# Patient Record
Sex: Female | Born: 1985 | Race: White | Hispanic: No | Marital: Married | Smoking: Never smoker
Health system: Southern US, Community
[De-identification: ages and names within clinical notes are randomized; demographics above are authoritative.]

## PROBLEM LIST (undated history)

## (undated) DIAGNOSIS — Z9889 Other specified postprocedural states: Secondary | ICD-10-CM

## (undated) DIAGNOSIS — J45909 Unspecified asthma, uncomplicated: Secondary | ICD-10-CM

## (undated) DIAGNOSIS — F329 Major depressive disorder, single episode, unspecified: Secondary | ICD-10-CM

## (undated) DIAGNOSIS — K219 Gastro-esophageal reflux disease without esophagitis: Secondary | ICD-10-CM

## (undated) DIAGNOSIS — A048 Other specified bacterial intestinal infections: Secondary | ICD-10-CM

## (undated) DIAGNOSIS — M1990 Unspecified osteoarthritis, unspecified site: Secondary | ICD-10-CM

## (undated) DIAGNOSIS — I209 Angina pectoris, unspecified: Secondary | ICD-10-CM

## (undated) DIAGNOSIS — K589 Irritable bowel syndrome without diarrhea: Secondary | ICD-10-CM

## (undated) DIAGNOSIS — F419 Anxiety disorder, unspecified: Secondary | ICD-10-CM

## (undated) DIAGNOSIS — T7840XA Allergy, unspecified, initial encounter: Secondary | ICD-10-CM

## (undated) DIAGNOSIS — G43909 Migraine, unspecified, not intractable, without status migrainosus: Secondary | ICD-10-CM

## (undated) DIAGNOSIS — D649 Anemia, unspecified: Secondary | ICD-10-CM

## (undated) DIAGNOSIS — F32A Depression, unspecified: Secondary | ICD-10-CM

## (undated) HISTORY — PX: DILATION AND CURETTAGE OF UTERUS: SHX78

## (undated) HISTORY — DX: Gastro-esophageal reflux disease without esophagitis: K21.9

## (undated) HISTORY — DX: Unspecified osteoarthritis, unspecified site: M19.90

## (undated) HISTORY — PX: OTHER SURGICAL HISTORY: SHX169

## (undated) HISTORY — DX: Anemia, unspecified: D64.9

## (undated) HISTORY — DX: Allergy, unspecified, initial encounter: T78.40XA

## (undated) HISTORY — PX: HERNIA REPAIR: SHX51

## (undated) HISTORY — DX: Other specified bacterial intestinal infections: A04.8

## (undated) HISTORY — PX: WISDOM TOOTH EXTRACTION: SHX21

---

## 2011-05-18 DIAGNOSIS — F419 Anxiety disorder, unspecified: Secondary | ICD-10-CM | POA: Insufficient documentation

## 2011-05-19 DIAGNOSIS — E739 Lactose intolerance, unspecified: Secondary | ICD-10-CM

## 2011-05-19 DIAGNOSIS — K589 Irritable bowel syndrome without diarrhea: Secondary | ICD-10-CM | POA: Insufficient documentation

## 2011-05-19 DIAGNOSIS — J45909 Unspecified asthma, uncomplicated: Secondary | ICD-10-CM | POA: Insufficient documentation

## 2011-05-19 HISTORY — DX: Lactose intolerance, unspecified: E73.9

## 2011-05-19 HISTORY — DX: Irritable bowel syndrome, unspecified: K58.9

## 2011-12-26 DIAGNOSIS — G43909 Migraine, unspecified, not intractable, without status migrainosus: Secondary | ICD-10-CM | POA: Insufficient documentation

## 2011-12-26 HISTORY — DX: Migraine, unspecified, not intractable, without status migrainosus: G43.909

## 2017-01-17 ENCOUNTER — Encounter: Payer: Self-pay | Admitting: *Deleted

## 2017-01-17 ENCOUNTER — Emergency Department
Admission: EM | Admit: 2017-01-17 | Discharge: 2017-01-17 | Disposition: A | Discharge status: Home / Self Care | Payer: BLUE CROSS/BLUE SHIELD | Source: Home / Self Care | Attending: Family Medicine | Admitting: Family Medicine

## 2017-01-17 DIAGNOSIS — R51 Headache: Secondary | ICD-10-CM

## 2017-01-17 DIAGNOSIS — R519 Headache, unspecified: Secondary | ICD-10-CM

## 2017-01-17 HISTORY — DX: Migraine, unspecified, not intractable, without status migrainosus: G43.909

## 2017-01-17 HISTORY — DX: Depression, unspecified: F32.A

## 2017-01-17 HISTORY — DX: Major depressive disorder, single episode, unspecified: F32.9

## 2017-01-17 HISTORY — DX: Anxiety disorder, unspecified: F41.9

## 2017-01-17 HISTORY — DX: Irritable bowel syndrome, unspecified: K58.9

## 2017-01-17 HISTORY — DX: Unspecified asthma, uncomplicated: J45.909

## 2017-01-17 MED ORDER — KETOROLAC TROMETHAMINE 60 MG/2ML IM SOLN
60.0000 mg | Freq: Once | INTRAMUSCULAR | Status: AC
  Administered 2017-01-17: 60 mg via INTRAMUSCULAR

## 2017-01-17 MED ORDER — DEXAMETHASONE SODIUM PHOSPHATE 10 MG/ML IJ SOLN
10.0000 mg | Freq: Once | INTRAMUSCULAR | Status: AC
  Administered 2017-01-17: 10 mg via INTRAMUSCULAR

## 2017-01-17 MED ORDER — METOCLOPRAMIDE HCL 5 MG/ML IJ SOLN
5.0000 mg | Freq: Once | INTRAMUSCULAR | Status: AC
  Administered 2017-01-17: 5 mg via INTRAMUSCULAR

## 2017-01-17 NOTE — ED Provider Notes (Signed)
CSN: [id number]     Arrival date & time 01/17/17  7124 History   First MD Initiated Contact with Patient 01/17/17 [id number]     Chief Complaint  Patient presents with  . Migraine   (Consider location/radiation/quality/duration/timing/severity/associated sxs/prior Treatment) HPI Madison Morris is a 31 y.o. female presenting to UC with c/o 2 days of persistent headache that feels similar to prior migraines.  Pt is a mother of 48 young girls, husband left for work yesterday.  HA is currently 6/10, aching and sore, associated nausea and phonophobia.  She reports yesterday she laid down in her bed, her 3 young girls came in and were having "quiet time" but kept getting louder and louder, making her HA worse and worse. Pt states she felt like she was going to "pass out" so she took some Klonopin as well as applied a new hemp oil to her forehead and neck.  She fell asleep for about 75 minutes.  She typically gets drowsy with her Klonopin but did not know until later after reading the side effects of the hemp oil that it can cause drowsiness as well.  She called her headache specialist yesterday as HA has persisted despite using her Maxalt and Topamax but they have not called back yet.  She is unsure what triggers her migraines. No recent illness. No fever, chills or vomiting.  No recent head trauma.denies weakness or numbness.    Past Medical History:  Diagnosis Date  . Anxiety   . Asthma   . Depression   . IBS (irritable bowel syndrome)   . Migraine    Past Surgical History:  Procedure Laterality Date  . DILATION AND CURETTAGE OF UTERUS     Family History  Problem Relation Age of Onset  . Pulmonary Hypertension Mother    Social History  Substance Use Topics  . Smoking status: Never Smoker  . Smokeless tobacco: Never Used  . Alcohol use No   OB History    No data available     Review of Systems  Constitutional: Negative for chills and fever.  HENT: Positive for ear pain (Left). Negative for  ear discharge and hearing loss.   Eyes: Positive for photophobia. Negative for pain and visual disturbance.  Gastrointestinal: Positive for nausea. Negative for abdominal pain, diarrhea and vomiting.  Musculoskeletal: Negative for arthralgias, back pain, myalgias, neck pain and neck stiffness.  Skin: Negative for rash.  Neurological: Positive for light-headedness and headaches. Negative for dizziness, syncope, weakness and numbness.    Allergies  Retin-a [tretinoin]  Home Medications   Prior to Admission medications   Medication Sig Start Date End Date Taking? Authorizing Provider  baclofen (LIORESAL) 10 MG tablet Take 10 mg by mouth 3 (three) times daily.   Yes [provider]  Cholecalciferol (VITAMIN D PO) Take by mouth.   Yes [provider]  clonazePAM (KLONOPIN) 0.5 MG tablet Take 0.5 mg by mouth 2 (two) times daily as needed for anxiety.   Yes [provider]  fluticasone (FLONASE) 50 MCG/ACT nasal spray Place into both nostrils daily.   Yes [provider]  Hyoscyamine Sulfate (HYOSCYAMINE PO) Take by mouth.   Yes [provider]  ondansetron (ZOFRAN) 4 MG tablet Take 4 mg by mouth every 8 (eight) hours as needed for nausea or vomiting.   Yes [provider]  rizatriptan (MAXALT) 10 MG tablet Take 10 mg by mouth as needed for migraine. May repeat in 2 hours if needed   Yes [provider]  topiramate (TOPAMAX) 25 MG capsule Take 25 mg by mouth 2 (two) times daily.   Yes [provider]   Meds Ordered and Administered this Visit   Medications  ketorolac (TORADOL) injection 60 mg (60 mg Intramuscular Given 01/17/17 0926)  dexamethasone (DECADRON) injection 10 mg (10 mg Intramuscular Given 01/17/17 0927)  metoCLOPramide (REGLAN) injection 5 mg (5 mg Intramuscular Given 01/17/17 0927)    BP 110/73 (BP Location: Left Arm)   Pulse 65   Temp 97.7 F (36.5 C) (Oral)   Resp 16   Ht 5' 6.5" (1.689 m)   Wt 135 lb  (61.2 kg)   LMP 01/07/2017   SpO2 98%   BMI 21.46 kg/m  No data found.   Physical Exam  Constitutional: She is oriented to person, place, and time. She appears well-developed and well-nourished. No distress.  HENT:  Head: Normocephalic and atraumatic.  Right Ear: Tympanic membrane normal.  Left Ear: Tympanic membrane normal.  Nose: Nose normal.  Mouth/Throat: Uvula is midline, oropharynx is clear and moist and mucous membranes are normal.  Eyes: Conjunctivae and EOM are normal. Pupils are equal, round, and reactive to light. Right eye exhibits no discharge. Left eye exhibits no discharge.  Neck: Normal range of motion.  Cardiovascular: Normal rate and regular rhythm.   Pulmonary/Chest: Effort normal and breath sounds normal. No respiratory distress. She has no wheezes. She has no rales.  Musculoskeletal: Normal range of motion.  Neurological: She is alert and oriented to person, place, and time. No cranial nerve deficit.  CN II-XII in tact. Speech is clear. Alert to person, place, and time. Normal gait.   Skin: Skin is warm and dry. She is not diaphoretic.  Psychiatric: She has a normal mood and affect. Her behavior is normal.  Nursing note and vitals reviewed.   Urgent Care Course     Procedures (including critical care time)  Labs Review Labs Reviewed - No data to display  Imaging Review No results found.    MDM   1. Persistent headaches    Pt c/o persistent migraine for 2 days. Pt is a mother of 78 young girls. Normal neuro exam. Pt appears well, non-toxic. Normal vitals. Doubt SAH or CVA  Toradol 60mg , Decadron 10mg  and Reglan 5mg  given IM Allowed to rest in UC for 20-30 minutes. Pain has been unchanged from 6/10, however, pt's 3 young girls also with her in exam room.   Pt feels comfortable being discharged home. Encouraged pt to try to nap today in a cool dark room. If HA continues to worsen, go to hospital for further evaluation and treatment. Encouraged  to try calling her HA specialist today as well.  Pt agreeable.    Noe Gens, PA-C 01/17/17 1006

## 2017-01-17 NOTE — ED Triage Notes (Signed)
Pt c/o migraine x 2 days with no relief from her migraine medications. She reports that she had an episode of LOC while laying in her bed yesterday after taking Klonopin and applying a new hemp oil to her forehead and neck; reports her fit bit said she was "out" for 75 minutes. No migraines meds today.

## 2018-05-29 ENCOUNTER — Ambulatory Visit (INDEPENDENT_AMBULATORY_CARE_PROVIDER_SITE_OTHER): Payer: BLUE CROSS/BLUE SHIELD | Admitting: Advanced Practice Midwife

## 2018-05-29 ENCOUNTER — Encounter: Payer: Self-pay | Admitting: Advanced Practice Midwife

## 2018-05-29 VITALS — BP 118/72 | HR 95 | Resp 16 | Ht 66.0 in | Wt 144.0 lb

## 2018-05-29 DIAGNOSIS — Z01419 Encounter for gynecological examination (general) (routine) without abnormal findings: Secondary | ICD-10-CM | POA: Diagnosis not present

## 2018-05-29 DIAGNOSIS — R109 Unspecified abdominal pain: Secondary | ICD-10-CM

## 2018-05-29 DIAGNOSIS — N939 Abnormal uterine and vaginal bleeding, unspecified: Secondary | ICD-10-CM

## 2018-05-29 DIAGNOSIS — Z1151 Encounter for screening for human papillomavirus (HPV): Secondary | ICD-10-CM | POA: Diagnosis not present

## 2018-05-29 DIAGNOSIS — Z124 Encounter for screening for malignant neoplasm of cervix: Secondary | ICD-10-CM | POA: Diagnosis not present

## 2018-05-29 DIAGNOSIS — R61 Generalized hyperhidrosis: Secondary | ICD-10-CM | POA: Insufficient documentation

## 2018-05-29 DIAGNOSIS — N951 Menopausal and female climacteric states: Secondary | ICD-10-CM

## 2018-05-29 NOTE — Patient Instructions (Signed)
Abnormal Uterine Bleeding Abnormal uterine bleeding can affect women at various stages in life, including teenagers, women in their reproductive years, pregnant women, and women who have reached menopause. Several kinds of uterine bleeding are considered abnormal, including:  Bleeding or spotting between periods.  Bleeding after sexual intercourse.  Bleeding that is heavier or more than normal.  Periods that last longer than usual.  Bleeding after menopause.  Many cases of abnormal uterine bleeding are minor and simple to treat, while others are more serious. Any type of abnormal bleeding should be evaluated by your health care provider. Treatment will depend on the cause of the bleeding. Follow these instructions at home: Monitor your condition for any changes. The following actions may help to alleviate any discomfort you are experiencing:  Avoid the use of tampons and douches as directed by your health care provider.  Change your pads frequently.  You should get regular pelvic exams and Pap tests. Keep all follow-up appointments for diagnostic tests as directed by your health care provider. Contact a health care provider if:  Your bleeding lasts more than 1 week.  You feel dizzy at times. Get help right away if:  You pass out.  You are changing pads every 15 to 30 minutes.  You have abdominal pain.  You have a fever.  You become sweaty or weak.  You are passing large blood clots from the vagina.  You start to feel nauseous and vomit. This information is not intended to replace advice given to you by your health care provider. Make sure you discuss any questions you have with your health care provider. Document Released: 06/27/2005 Document Revised: 12/09/2015 Document Reviewed: 01/24/2013 Elsevier Interactive Patient Education  2017 [place] is the time when your body begins to move into the menopause (no menstrual period for 12  straight months). It is a natural process. Perimenopause can begin 2-8 years before the menopause and usually lasts for 1 year after the menopause. During this time, your ovaries may or may not produce an egg. The ovaries vary in their production of estrogen and progesterone hormones each month. This can cause irregular menstrual periods, difficulty getting pregnant, vaginal bleeding between periods, and uncomfortable symptoms. What are the causes?  Irregular production of the ovarian hormones, estrogen and progesterone, and not ovulating every month. Other causes include:  Tumor of the pituitary gland in the brain.  Medical disease that affects the ovaries.  Radiation treatment.  Chemotherapy.  Unknown causes.  Heavy smoking and excessive alcohol intake can bring on perimenopause sooner.  What are the signs or symptoms?  Hot flashes.  Night sweats.  Irregular menstrual periods.  Decreased sex drive.  Vaginal dryness.  Headaches.  Mood swings.  Depression.  Memory problems.  Irritability.  Tiredness.  Weight gain.  Trouble getting pregnant.  The beginning of losing bone cells (osteoporosis).  The beginning of hardening of the arteries (atherosclerosis). How is this diagnosed? Your health care provider will make a diagnosis by analyzing your age, menstrual history, and symptoms. He or she will do a physical exam and note any changes in your body, especially your female organs. Female hormone tests may or may not be helpful depending on the amount of female hormones you produce and when you produce them. However, other hormone tests may be helpful to rule out other problems. How is this treated? In some cases, no treatment is needed. The decision on whether treatment is necessary during the perimenopause should be  made by you and your health care provider based on how the symptoms are affecting you and your lifestyle. Various treatments are available, such  as:  Treating individual symptoms with a specific medicine for that symptom.  Herbal medicines that can help specific symptoms.  Counseling.  Group therapy.  Follow these instructions at home:  Keep track of your menstrual periods (when they occur, how heavy they are, how long between periods, and how long they last) as well as your symptoms and when they started.  Only take over-the-counter or prescription medicines as directed by your health care provider.  Sleep and rest.  Exercise.  Eat a diet that contains calcium (good for your bones) and soy (acts like the estrogen hormone).  Do not smoke.  Avoid alcoholic beverages.  Take vitamin supplements as recommended by your health care provider. Taking vitamin E may help in certain cases.  Take calcium and vitamin D supplements to help prevent bone loss.  Group therapy is sometimes helpful.  Acupuncture may help in some cases. Contact a health care provider if:  You have questions about any symptoms you are having.  You need a referral to a specialist (gynecologist, psychiatrist, or psychologist). Get help right away if:  You have vaginal bleeding.  Your period lasts longer than 8 days.  Your periods are recurring sooner than 21 days.  You have bleeding after intercourse.  You have severe depression.  You have pain when you urinate.  You have severe headaches.  You have vision problems. This information is not intended to replace advice given to you by your health care provider. Make sure you discuss any questions you have with your health care provider. Document Released: 08/04/2004 Document Revised: 12/03/2015 Document Reviewed: 01/24/2013 Elsevier Interactive Patient Education  2017 [employer]   Fatigue Fatigue is feeling tired all of the time, a lack of energy, or a lack of motivation. Occasional or mild fatigue is often a normal response to activity or life in general. However, long-lasting  (chronic) or extreme fatigue may indicate an underlying medical condition. Follow these instructions at home: Watch your fatigue for any changes. The following actions may help to lessen any discomfort you are feeling:  Talk to your health care provider about how much sleep you need each night. Try to get the required amount every night.  Take medicines only as directed by your health care provider.  Eat a healthy and nutritious diet. Ask your health care provider if you need help changing your diet.  Drink enough fluid to keep your urine clear or pale yellow.  Practice ways of relaxing, such as yoga, meditation, massage therapy, or acupuncture.  Exercise regularly.  Change situations that cause you stress. Try to keep your work and personal routine reasonable.  Do not abuse illegal drugs.  Limit alcohol intake to no more than 1 drink per day for nonpregnant women and 2 drinks per day for men. One drink equals 12 ounces of beer, 5 ounces of wine, or 1 ounces of hard liquor.  Take a multivitamin, if directed by your health care provider.  Contact a health care provider if:  Your fatigue does not get better.  You have a fever.  You have unintentional weight loss or gain.  You have headaches.  You have difficulty: ? Falling asleep. ? Sleeping throughout the night.  You feel angry, guilty, anxious, or sad.  You are unable to have a bowel movement (constipation).  You skin is dry.  Your legs  or another part of your body is swollen. Get help right away if:  You feel confused.  Your vision is blurry.  You feel faint or pass out.  You have a severe headache.  You have severe abdominal, pelvic, or back pain.  You have chest pain, shortness of breath, or an irregular or fast heartbeat.  You are unable to urinate or you urinate less than normal.  You develop abnormal bleeding, such as bleeding from the rectum, vagina, nose, lungs, or nipples.  You vomit  blood.  You have thoughts about harming yourself or committing suicide.  You are worried that you might harm someone else. This information is not intended to replace advice given to you by your health care provider. Make sure you discuss any questions you have with your health care provider. Document Released: 04/24/2007 Document Revised: 12/03/2015 Document Reviewed: 10/29/2013 Elsevier Interactive Patient Education  [employer].

## 2018-05-29 NOTE — Progress Notes (Signed)
Subjective:     Madison Morris is a 32 y.o. female here for a routine exam.  Current complaints: night sweats, fatigue, irregular menses, feeling like in a "fog". Symptoms x 3-4 months.  Has seen primary care but no resolution.  Abdominal pain at umbilicus since hernia repair 15 months ago.  Daily stabbing pain, unresolved with heat or NSAIDs.  Has had CT scan from general surgeon which was wnl.     Personal health questionnaire reviewed: yes.   Gynecologic History Patient's last menstrual period was 05/08/2018. Contraception: vasectomy Last Pap: 2018. Results were: normal Last mammogram: n/a. Results were: n/a  Obstetric History OB History  Gravida Para Term Preterm AB Living  3 3 3     3   SAB TAB Ectopic Multiple Live Births               # Outcome Date GA Lbr Len/2nd Weight Sex Delivery Anes PTL Lv  3 Term      Vag-Spont     2 Term      Vag-Spont     1 Term      Vag-Spont        The following portions of the patient's history were reviewed and updated as appropriate: allergies, current medications, past family history, past medical history, past social history, past surgical history and problem list.  Review of Systems Pertinent items noted in HPI and remainder of comprehensive ROS otherwise negative.    Objective:  BP 118/72   Pulse 95   Resp 16   Ht 5\' 6"  (1.676 m)   Wt 65.3 kg   LMP 05/08/2018   BMI 23.24 kg/m    VS reviewed, nursing note reviewed,  Constitutional: well developed, well nourished, no distress HEENT: normocephalic CV: normal rate Pulm/chest wall: normal effort Breast Exam:  right breast normal without mass, skin or nipple changes or axillary nodes, left breast normal without mass, skin or nipple changes or axillary nodes Abdomen: soft, mild tenderness at left lower margin of mesh, ~3 cm to left and below umbilicus Neuro: alert and oriented x 3 Skin: warm, dry Psych: affect normal Pelvic exam: Cervix pink, visually closed, without lesion, scant  white creamy discharge, vaginal walls and external genitalia normal Bimanual exam: Cervix 0/long/high, firm, anterior, neg CMT, uterus nontender, nonenlarged, adnexa without tenderness, enlargement, or mass  Assessment/Plan:   1. Well woman exam with routine gynecological exam --Pt prefers Pap today, discussed guidelines, with cotesting can go 5 years but still recommend annual well woman visit.  - Cytology - PAP( [facility])  2. Perimenopausal symptom --Pt with vague, nonspecific symptoms but with AUB and night sweats, will evaluate for hormonal changes. - hCG, serum, qualitative - Prolactin - TSH - CBC - Testos,Total,Free and SHBG (Female)  3. Abnormal uterine bleeding (AUB)  - US PELVIC COMPLETE WITH TRANSVAGINAL; Future  4. Abdominal pain in female patient --Pain appears related to surgery, scar tissue?, mesh?  Pt to follow up with general surgery again.  May improve over time but slowly if it is scar tissue.   Fatima Blank, CNM 11:15 AM

## 2018-05-30 LAB — TSH: TSH: 0.84 mIU/L

## 2018-05-30 LAB — HCG, SERUM, QUALITATIVE: PREG SERUM: NEGATIVE

## 2018-05-30 LAB — CBC
HCT: 43 % (ref 35.0–45.0)
Hemoglobin: 14.3 g/dL (ref 11.7–15.5)
MCH: 29.7 pg (ref 27.0–33.0)
MCHC: 33.3 g/dL (ref 32.0–36.0)
MCV: 89.4 fL (ref 80.0–100.0)
MPV: 9.5 fL (ref 7.5–12.5)
Platelets: 309 10*3/uL (ref 140–400)
RBC: 4.81 10*6/uL (ref 3.80–5.10)
RDW: 12.4 % (ref 11.0–15.0)
WBC: 6 10*3/uL (ref 3.8–10.8)

## 2018-05-30 LAB — PROLACTIN: Prolactin: 10.6 ng/mL

## 2018-05-31 ENCOUNTER — Ambulatory Visit (INDEPENDENT_AMBULATORY_CARE_PROVIDER_SITE_OTHER): Payer: BLUE CROSS/BLUE SHIELD

## 2018-05-31 DIAGNOSIS — N939 Abnormal uterine and vaginal bleeding, unspecified: Secondary | ICD-10-CM

## 2018-05-31 LAB — CYTOLOGY - PAP
DIAGNOSIS: UNDETERMINED — AB
HPV (WINDOPATH): NOT DETECTED

## 2018-06-04 LAB — TESTOS,TOTAL,FREE AND SHBG (FEMALE)
Free Testosterone: 2 pg/mL (ref 0.1–6.4)
Sex Hormone Binding: 96 nmol/L (ref 17–124)
Testosterone, Total, LC-MS-MS: 24 ng/dL (ref 2–45)

## 2019-05-30 ENCOUNTER — Other Ambulatory Visit: Payer: Self-pay

## 2019-05-30 ENCOUNTER — Emergency Department
Admission: EM | Admit: 2019-05-30 | Discharge: 2019-05-30 | Disposition: A | Discharge status: Home / Self Care | Payer: BC Managed Care – PPO | Source: Home / Self Care

## 2019-05-30 ENCOUNTER — Encounter: Payer: Self-pay | Admitting: Emergency Medicine

## 2019-05-30 DIAGNOSIS — L02416 Cutaneous abscess of left lower limb: Secondary | ICD-10-CM

## 2019-05-30 MED ORDER — AMOXICILLIN-POT CLAVULANATE 400-57 MG/5ML PO SUSR: 875.0000 mg | Freq: Two times a day (BID) | ORAL | 0 refills | Status: AC

## 2019-05-30 NOTE — ED Provider Notes (Signed)
[facility]    CSN: [id number] Arrival date & time: 05/30/19  1053      History   Chief Complaint Chief Complaint  Patient presents with  . Cyst    HPI Madison Morris is a 33 y.o. female.   HPI Madison Morris is a 33 y.o. female presenting to UC with c/o gradually worsening red tender swollen warm bump on the back of her Left thigh. She initially noticed a small pimple type bump about 1-2 months ago but area has becoming more red, enlarged and tender over the last 1 week.  She tried f/u with her PCP but due to 24 hours of nasal congestion, she was advised she needed a negative Covid test prior to being sense.  She did take an Epson salt bath last night with moderate temporary relief. No hx of abscesses before.   Past Medical History:  Diagnosis Date  . Anxiety   . Asthma   . Depression   . H. pylori infection   . IBS (irritable bowel syndrome)   . Migraine     Patient Active Problem List   Diagnosis Date Noted  . Unexplained night sweats 05/29/2018    Past Surgical History:  Procedure Laterality Date  . DILATION AND CURETTAGE OF UTERUS    . umbilcal hernia    . WISDOM TOOTH EXTRACTION      OB History    Gravida  3   Para  3   Term  3   Preterm      AB      Living  3     SAB      TAB      Ectopic      Multiple      Live Births               Home Medications    Prior to Admission medications   Medication Sig Start Date End Date Taking? Authorizing Provider  amoxicillin-clavulanate (AUGMENTIN) 400-57 MG/5ML suspension Take 10.9 mLs (872 mg total) by mouth 2 (two) times daily for 7 days. 05/30/19 06/06/19  Noe Gens, PA-C  baclofen (LIORESAL) 10 MG tablet Take 10 mg by mouth 3 (three) times daily.    [provider]  beclomethasone (QVAR) 80 MCG/ACT inhaler Place 2 puffs into the nose as needed.    [provider]  clonazePAM (KLONOPIN) 0.5 MG tablet Take 0.5 mg by mouth 2 (two) times daily as needed for  anxiety.    [provider]  fluticasone (FLONASE) 50 MCG/ACT nasal spray Place into both nostrils daily.    [provider]  Hyoscyamine Sulfate (HYOSCYAMINE PO) Take by mouth.    [provider]  Magnesium Carbonate (MAGNESIUM GLUCONATE) 54mg /52ml syringe Take by mouth.    [provider]  montelukast (SINGULAIR) 10 MG tablet Take 10 mg by mouth at bedtime.    [provider]  Multiple Vitamins-Minerals (MULTIVITAMIN GUMMIES WOMENS PO) Take by mouth.    [provider]  ondansetron (ZOFRAN) 4 MG tablet Take 4 mg by mouth every 8 (eight) hours as needed for nausea or vomiting.    [provider]  Probiotic Product (PROBIOTIC-10 PO) Take 1 tablet by mouth daily.    [provider]  rizatriptan (MAXALT) 10 MG tablet Take 10 mg by mouth as needed for migraine. May repeat in 2 hours if needed    [provider]  topiramate (TOPAMAX) 25 MG capsule Take 25 mg by mouth 2 (two) times  daily.    [provider]  vitamin B-12 (CYANOCOBALAMIN) 100 MCG tablet Vitamin B-12    [provider]  VITAMIN D PO Take 1 tablet by mouth daily.    [provider]    Family History Family History  Problem Relation Age of Onset  . Pulmonary Hypertension Mother     Social History Social History   Tobacco Use  . Smoking status: Never Smoker  . Smokeless tobacco: Never Used  Substance Use Topics  . Alcohol use: No  . Drug use: No     Allergies   Retin-a [tretinoin]   Review of Systems Review of Systems  Constitutional: Negative for chills and fever.  Musculoskeletal: Negative for arthralgias and myalgias.  Skin: Positive for color change. Negative for wound.     Physical Exam Triage Vital Signs ED Triage Vitals  Enc Vitals Group     BP 05/30/19 1122 (!) 136/96     Pulse Rate 05/30/19 1122 88     Resp --      Temp 05/30/19 1122 99.1 F (37.3 C)     Temp Source 05/30/19 1122 Oral      SpO2 05/30/19 1122 100 %     Weight 05/30/19 1118 138 lb (62.6 kg)     Height --      Head Circumference --      Peak Flow --      Pain Score 05/30/19 1117 3     Pain Loc --      Pain Edu? --      Excl. in [place]? --    No data found.  Updated Vital Signs BP (!) 136/96 (BP Location: Right Arm)   Pulse 88   Temp 99.1 F (37.3 C) (Oral)   Wt 138 lb (62.6 kg)   LMP 05/30/2019   SpO2 100%   BMI 22.27 kg/m   Visual Acuity Right Eye Distance:   Left Eye Distance:   Bilateral Distance:    Right Eye Near:   Left Eye Near:    Bilateral Near:     Physical Exam Vitals signs and nursing note reviewed.  Constitutional:      Appearance: Normal appearance. She is well-developed.  HENT:     Head: Normocephalic and atraumatic.  Neck:     Musculoskeletal: Normal range of motion.  Cardiovascular:     Rate and Rhythm: Normal rate.  Pulmonary:     Effort: Pulmonary effort is normal.  Musculoskeletal: Normal range of motion.  Skin:    General: Skin is warm and dry.     Findings: Erythema present.       Neurological:     Mental Status: She is alert and oriented to person, place, and time.  Psychiatric:        Behavior: Behavior normal.      UC Treatments / Results  Labs (all labs ordered are listed, but only abnormal results are displayed) Labs Reviewed - No data to display  EKG   Radiology No results found.  Procedures Procedures (including critical care time)  Medications Ordered in UC Medications - No data to display  Initial Impression / Assessment and Plan / UC Course  I have reviewed the triage vital signs and the nursing notes.  Pertinent labs & imaging results that were available during my care of the patient were reviewed by me and considered in my medical decision making (see chart for details).     Early abscess of Left thigh Will start on antibiotics  Pt requested liquid medication.  AVS provided  Final Clinical Impressions(s) / UC Diagnoses    Final diagnoses:  Abscess of left thigh     Discharge Instructions      Please take antibiotics as prescribed and be sure to complete entire course even if you start to feel better to ensure infection does not come back.  Follow up in 3-4 days if not improving, sooner if worsening.     ED Prescriptions    Medication Sig Dispense Auth. Provider   amoxicillin-clavulanate (AUGMENTIN) 400-57 MG/5ML suspension Take 10.9 mLs (872 mg total) by mouth 2 (two) times daily for 7 days. 152.6 mL Noe Gens, PA-C     PDMP not reviewed this encounter.   Noe Gens, PA-C 05/30/19 1408

## 2019-05-30 NOTE — ED Triage Notes (Signed)
Pt states she noticed a cyst on the back of her left thigh last week. Getting larger and now red and warm. She tried to make appt with pcp but they will not see her until she has a negative covid test. She does have a test pending because she got one as preventative earlier this week.

## 2019-05-30 NOTE — Discharge Instructions (Signed)
°  Please take antibiotics as prescribed and be sure to complete entire course even if you start to feel better to ensure infection does not come back.  Follow up in 3-4 days if not improving, sooner if worsening.

## 2019-06-10 ENCOUNTER — Encounter

## 2020-01-07 ENCOUNTER — Other Ambulatory Visit: Payer: Self-pay

## 2020-01-07 ENCOUNTER — Emergency Department
Admission: EM | Admit: 2020-01-07 | Discharge: 2020-01-07 | Disposition: A | Discharge status: Home / Self Care | Payer: BLUE CROSS/BLUE SHIELD | Source: Home / Self Care | Attending: Family Medicine | Admitting: Family Medicine

## 2020-01-07 DIAGNOSIS — G43009 Migraine without aura, not intractable, without status migrainosus: Secondary | ICD-10-CM

## 2020-01-07 MED ORDER — DEXAMETHASONE SODIUM PHOSPHATE 10 MG/ML IJ SOLN
10.0000 mg | Freq: Once | INTRAMUSCULAR | Status: AC
  Administered 2020-01-07: 10 mg via INTRAMUSCULAR

## 2020-01-07 MED ORDER — METOCLOPRAMIDE HCL 5 MG/ML IJ SOLN
5.0000 mg | Freq: Once | INTRAMUSCULAR | Status: AC
  Administered 2020-01-07: 5 mg via INTRAMUSCULAR

## 2020-01-07 MED ORDER — KETOROLAC TROMETHAMINE 60 MG/2ML IM SOLN
60.0000 mg | Freq: Once | INTRAMUSCULAR | Status: AC
  Administered 2020-01-07: 60 mg via INTRAMUSCULAR

## 2020-01-07 NOTE — ED Triage Notes (Signed)
Patient presents to Urgent Care with complaints of migraine headaceh since 5 days ago. Patient reports she was given the okay to take her headache medications early by her doctor, is going on vacation tomorrow and would like her headache to be gone before she leaves.  Pt denies vision changes, intermittent nausea. Does have vertigo.

## 2020-01-07 NOTE — Discharge Instructions (Addendum)
Rest.  Increase fluid intake.  If symptoms become significantly worse during the night or over the weekend, proceed to the local emergency room.

## 2020-01-07 NOTE — ED Provider Notes (Signed)
[facility]    CSN: [id number] Arrival date & time: 01/07/20  1027      History   Chief Complaint Chief Complaint  Patient presents with  . Migraine    HPI Madison Morris is a 34 y.o. female.   Patient has a long history of migraine headaches.  She developed a typical recurrent headache 5 days ago, not responding to her usual regimen.  She has had mild intermittent nausea without vomiting, and intermittent mild vertigo.  She denies other neurologic symptoms.  She denies fevers, chills, and sweats and feels well otherwise.  The history is provided by the patient.  Migraine This is a recurrent problem. The current episode started more than 2 days ago. The problem occurs constantly. The problem has not changed since onset.Exacerbated by: light. Nothing relieves the symptoms.    Past Medical History:  Diagnosis Date  . Anxiety   . Asthma   . Depression   . H. pylori infection   . IBS (irritable bowel syndrome)   . Migraine     Patient Active Problem List   Diagnosis Date Noted  . Unexplained night sweats 05/29/2018    Past Surgical History:  Procedure Laterality Date  . DILATION AND CURETTAGE OF UTERUS    . umbilcal hernia    . WISDOM TOOTH EXTRACTION      OB History    Gravida  3   Para  3   Term  3   Preterm      AB      Living  3     SAB      TAB      Ectopic      Multiple      Live Births               Home Medications    Prior to Admission medications   Medication Sig Start Date End Date Taking? Authorizing Provider  Fremanezumab-vfrm (AJOVY [place]) Inject into the skin.   Yes [provider]  Rimegepant Sulfate (NURTEC) 75 MG TBDP Take by mouth.   Yes [provider]  baclofen (LIORESAL) 10 MG tablet Take 10 mg by mouth 3 (three) times daily.    [provider]  beclomethasone (QVAR) 80 MCG/ACT inhaler Place 2 puffs into the nose as needed.    [provider]  clonazePAM (KLONOPIN) 0.5 MG  tablet Take 0.5 mg by mouth 2 (two) times daily as needed for anxiety.    [provider]  fluticasone (FLONASE) 50 MCG/ACT nasal spray Place into both nostrils daily.    [provider]  Hyoscyamine Sulfate (HYOSCYAMINE PO) Take by mouth.    [provider]  Magnesium Carbonate (MAGNESIUM GLUCONATE) 54mg /54ml syringe Take by mouth.    [provider]  montelukast (SINGULAIR) 10 MG tablet Take 10 mg by mouth at bedtime.    [provider]  Multiple Vitamins-Minerals (MULTIVITAMIN GUMMIES WOMENS PO) Take by mouth.    [provider]  ondansetron (ZOFRAN) 4 MG tablet Take 4 mg by mouth every 8 (eight) hours as needed for nausea or vomiting.    [provider]  Probiotic Product (PROBIOTIC-10 PO) Take 1 tablet by mouth daily.    [provider]  rizatriptan (MAXALT) 10 MG tablet Take 10 mg by mouth as needed for migraine. May repeat in 2 hours if needed    [provider]  topiramate (TOPAMAX) 25 MG capsule Take 25 mg by mouth 2 (two) times daily.  [provider]  vitamin B-12 (CYANOCOBALAMIN) 100 MCG tablet Vitamin B-12    [provider]  VITAMIN D PO Take 1 tablet by mouth daily.    [provider]    Family History Family History  Problem Relation Age of Onset  . Pulmonary Hypertension Mother   . Asthma Father     Social History Social History   Tobacco Use  . Smoking status: Never Smoker  . Smokeless tobacco: Never Used  Vaping Use  . Vaping Use: Never used  Substance Use Topics  . Alcohol use: No  . Drug use: No     Allergies   Retin-a [tretinoin]   Review of Systems Review of Systems  Constitutional: Positive for activity change and appetite change. Negative for chills, diaphoresis, fatigue and fever.  HENT: Negative for ear pain, rhinorrhea, sinus pressure, sinus pain, sore throat and tinnitus.   Eyes: Positive for photophobia.  Respiratory: Negative.     Cardiovascular: Negative.   Gastrointestinal: Positive for nausea. Negative for vomiting.  Genitourinary: Negative.   Musculoskeletal: Negative.   Skin: Negative.   Neurological: Negative for tremors, seizures, syncope, facial asymmetry, speech difficulty, weakness, light-headedness and numbness.  All other systems reviewed and are negative.    Physical Exam Triage Vital Signs ED Triage Vitals  Enc Vitals Group     BP 01/07/20 1041 123/84     Pulse Rate 01/07/20 1041 89     Resp 01/07/20 1041 18     Temp 01/07/20 1041 99 F (37.2 C)     Temp Source 01/07/20 1041 Oral     SpO2 01/07/20 1041 98 %     Weight --      Height --      Head Circumference --      Peak Flow --      Pain Score 01/07/20 1038 4     Pain Loc --      Pain Edu? --      Excl. in [place]? --    No data found.  Updated Vital Signs BP 123/84 (BP Location: Left Arm)   Pulse 89   Temp 99 F (37.2 C) (Oral)   Resp 18   LMP 01/06/2020   SpO2 98%   Visual Acuity Right Eye Distance:   Left Eye Distance:   Bilateral Distance:    Right Eye Near:   Left Eye Near:    Bilateral Near:     Physical Exam Nursing notes and Vital Signs reviewed. Appearance:  Patient appears stated age, and in no acute distress Eyes:  Pupils are equal, round, and reactive to light and accomodation.  Extraocular movement is intact.  Conjunctivae are not inflamed.  Fundi benign.  No photophobia.  Ears:  Canals normal.  Tympanic membranes normal.  Nose:   Normal turbinates.  No sinus tenderness.  Pharynx:  Normal Neck:  Supple.  No adenopathy.  Lungs:  Clear to auscultation.  Breath sounds are equal.  Moving air well. Heart:  Regular rate and rhythm without murmurs, rubs, or gallops.  Abdomen:  Nontender without masses or hepatosplenomegaly.  Bowel sounds are present.  No CVA or flank tenderness.  Extremities:  No edema.  Skin:  No rash present.  Neurologic:  Cranial nerves 2 through 12 are normal.  Patellar, achilles, and elbow  reflexes are normal.  Cerebellar function is intact (finger-to-nose and rapid alternating hand movement).   UC Treatments / Results  Labs (all labs ordered are listed, but only abnormal results are displayed)  Labs Reviewed - No data to display  EKG   Radiology No results found.  Procedures Procedures (including critical care time)  Medications Ordered in UC Medications  ketorolac (TORADOL) injection 60 mg (has no administration in time range)  dexamethasone (DECADRON) injection 10 mg (has no administration in time range)  metoCLOPramide (REGLAN) injection 5 mg (has no administration in time range)    Initial Impression / Assessment and Plan / UC Course  I have reviewed the triage vital signs and the nursing notes.  Pertinent labs & imaging results that were available during my care of the patient were reviewed by me and considered in my medical decision making (see chart for details).     Administered Toradol 60mg  IM, Reglan 5mg  IM, and Decadron 10mg  IM. Followup with Family Doctor if not improved in about 3 days.  Final Clinical Impressions(s) / UC Diagnoses   Final diagnoses:  Migraine without aura and without status migrainosus, not intractable     Discharge Instructions     Rest.  Increase fluid intake.  If symptoms become significantly worse during the night or over the weekend, proceed to the local emergency room.     ED Prescriptions    None        Kandra Nicolas, MD 01/07/20 1154

## 2020-04-20 ENCOUNTER — Telehealth (INDEPENDENT_AMBULATORY_CARE_PROVIDER_SITE_OTHER): Payer: BLUE CROSS/BLUE SHIELD | Admitting: Psychiatry

## 2020-04-20 ENCOUNTER — Encounter (HOSPITAL_COMMUNITY): Payer: Self-pay | Admitting: Psychiatry

## 2020-04-20 DIAGNOSIS — F331 Major depressive disorder, recurrent, moderate: Secondary | ICD-10-CM

## 2020-04-20 DIAGNOSIS — F32A Depression, unspecified: Secondary | ICD-10-CM | POA: Insufficient documentation

## 2020-04-20 DIAGNOSIS — F4001 Agoraphobia with panic disorder: Secondary | ICD-10-CM | POA: Diagnosis not present

## 2020-04-20 MED ORDER — BUPROPION HCL ER (SR) 100 MG PO TB12: 100.0000 mg | ORAL_TABLET | Freq: Every day | ORAL | 0 refills | Status: DC

## 2020-04-20 NOTE — Progress Notes (Signed)
Psychiatric Initial Adult Assessment   Patient Identification: Madison Morris MRN:  [id number] Date of Evaluation:  04/20/2020 Referral Source: primary care Chief Complaint:  anxiety, depression.  Visit Diagnosis:    ICD-10-CM   1. 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 MDD (major depressive disorder), recurrent episode, moderate ([place])  F33.1     I connected with Charl Wellen on 04/20/20 at 11:00 AM EDT by a video enabled telemedicine application and verified that I am speaking with the correct person using two identifiers.   I discussed the limitations of evaluation and management by telemedicine and the availability of in person appointments. The patient expressed understanding and agreed to proceed.  Patient location home Provider location home office  History of Present Illness:  34 years old married white female referred by PCP for anxiety and depression management  Patient has had history of depression 9 years ago when los her mom, she has gone thru sadness, grief, withdrawn and feeling of depression leading to getting treatment for depression has tried many meds but has fear of vomiting, or seeing vomit, fear of swallowing and most meds didn't work She has done [place] that helped depression and started feeling better Recently been feeling recurrence of sadness and anxiety, worriful, fear of going out or driving, 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 She does see therapist Rayetta Humphrey and may do EMDR to figure out if anything related to past contributing to her difficult to swallow feeling or vomit  related anxiety  Otherwise denies trauma or abuse in the past She has excessive worries and diffcult relationship with dad after her mom death . He re married one of her mom friend. Patient says that's fine but to expect to love her like her real mom or expecting that from dad was un reasonable  Denies psychotic symptoms or paranoia No clear mania history  Patient says has benefitted from zoloft on and off but would develop side effects Also has benefitted from wellbutrin in the past and felt upbeat or it helped depression but she developed side effects and had to stop or dose was high and she was also at times taking bid and effected sleep  She feels due to fear of going out or doing things around people or driving she feels disabled and may apply for disability  Aggravating factor: mom's death, fear of going out Modifying factor: kids, pets, marriage Severity: subdued, mixed anxiety and depression Duration more then 10 years  Denies drugs use Denies past psych admission or suicide attempt    Past Psychiatric History: depression, Grief  Previous Psychotropic Medications: Yes   Substance Abuse History in the last 12 months:  No.  Consequences of Substance Abuse: NA  Past Medical History:  Past Medical History:  Diagnosis Date  . Anxiety   . Asthma   . Depression   . H. pylori infection   . IBS (irritable bowel syndrome)   . Migraine     Past Surgical History:  Procedure Laterality Date  . DILATION AND CURETTAGE OF  UTERUS    . umbilcal hernia    . WISDOM TOOTH EXTRACTION      Family Psychiatric History: brother : some mental illness, doesn't know diagnosis Alcohol use amongst family members Cousin; depression  Family History:  Family History  Problem Relation Age of Onset  . Pulmonary Hypertension Mother   . Asthma Father     Social History:   Social History   Socioeconomic History  . Marital status: Married    Spouse name: Not on file  . Number of  children: Not on file  . Years of education: Not on file  . Highest education level: Not on file  Occupational History  . Occupation: homemaker  Tobacco Use  . Smoking status: Never Smoker  . Smokeless tobacco: Never Used  Vaping Use  . Vaping Use: Never used  Substance and Sexual Activity  . Alcohol use: No  . Drug use: No  . Sexual activity: Yes    Partners: Male    Comment: vasectomy  Other Topics Concern  . Not on file  Social History Narrative  . Not on file   Social Determinants of Health   Financial Resource Strain:   . Difficulty of Paying Living Expenses: Not on file  Food Insecurity:   . Worried About Charity fundraiser in the Last Year: Not on file  . Ran Out of Food in the Last Year: Not on file  Transportation Needs:   . Lack of Transportation (Medical): Not on file  . Lack of Transportation (Non-Medical): Not on file  Physical Activity:   . Days of Exercise per Week: Not on file  . Minutes of Exercise per Session: Not on file  Stress:   . Feeling of Stress : Not on file  Social Connections:   . Frequency of Communication with Friends and Family: Not on file  . Frequency of Social Gatherings with Friends and Family: Not on file  . Attends Religious Services: Not on file  . Active Member of Clubs or Organizations: Not on file  . Attends Archivist Meetings: Not on file  . Marital Status: Not on file    Additional Social History: grew up with parents and brother, no trauma but parents were controlling or kept discipline according to their rules Had good amount of friends growing up Married  69 years, 3 kids   Allergies:   Allergies  Allergen Reactions  . Sulfamethoxazole-Trimethoprim Nausea And Vomiting  . Retin-A [Tretinoin]     Metabolic Disorder Labs: No results found for: HGBA1C, MPG Lab Results  Component Value Date   PROLACTIN 10.6 05/29/2018   No results found for: CHOL, TRIG, HDL, CHOLHDL, VLDL, LDLCALC Lab Results   Component Value Date   TSH 0.84 05/29/2018    Therapeutic Level Labs: No results found for: LITHIUM No results found for: CBMZ No results found for: VALPROATE  Current Medications: Current Outpatient Medications  Medication Sig Dispense Refill  . baclofen (LIORESAL) 10 MG tablet Take 10 mg by mouth 3 (three) times daily.    . beclomethasone (QVAR) 80 MCG/ACT inhaler Place 2 puffs into the nose as needed.    Marland Kitchen buPROPion (WELLBUTRIN SR) 100 MG 12 hr tablet Take 1 tablet (100 mg total) by mouth daily. 30 tablet 0  . clonazePAM (KLONOPIN) 0.5 MG tablet Take 0.5 mg by mouth 2 (two) times daily as needed for anxiety.    . fluticasone (FLONASE) 50 MCG/ACT nasal spray Place into both nostrils daily.    Marland Kitchen  Fremanezumab-vfrm (AJOVY [place]) Inject into the skin.    Marland Kitchen Hyoscyamine Sulfate (HYOSCYAMINE PO) Take by mouth.    . Magnesium Carbonate (MAGNESIUM GLUCONATE) 54mg /80ml syringe Take by mouth.    . montelukast (SINGULAIR) 10 MG tablet Take 10 mg by mouth at bedtime.    . Multiple Vitamins-Minerals (MULTIVITAMIN GUMMIES WOMENS PO) Take by mouth.    . ondansetron (ZOFRAN) 4 MG tablet Take 4 mg by mouth every 8 (eight) hours as needed for nausea or vomiting.    . Probiotic Product (PROBIOTIC-10 PO) Take 1 tablet by mouth daily.    . Rimegepant Sulfate (NURTEC) 75 MG TBDP Take by mouth.    . rizatriptan (MAXALT) 10 MG tablet Take 10 mg by mouth as needed for migraine. May repeat in 2 hours if needed    . topiramate (TOPAMAX) 25 MG capsule Take 25 mg by mouth 2 (two) times daily.    . vitamin B-12 (CYANOCOBALAMIN) 100 MCG tablet Vitamin B-12    . VITAMIN D PO Take 1 tablet by mouth daily.     No current facility-administered medications for this visit.     Psychiatric Specialty Exam: Review of Systems  Respiratory: Negative for chest tightness.   Cardiovascular: Negative for chest pain.    There were no vitals taken for this visit.There is no height or weight on file to calculate BMI.  General  Appearance: Casual  Eye Contact:  Fair  Speech:  Clear and Coherent  Volume:  Normal  Mood:  somewhat subded  Affect:  Full Range  Thought Process:  Irrelevant  Orientation:  Full (Time, Place, and Person)  Thought Content:  Rumination  Suicidal Thoughts:  No  Homicidal Thoughts:  No  Memory:  Immediate;   Fair Recent;   Fair  Judgement:  Fair  Insight:  Fair  Psychomotor Activity:  Normal  Concentration:  Concentration: Fair and Attention Span: Fair  Recall:  Good  Fund of Knowledge:Good  Language: Good  Akathisia:  No  Handed:    AIMS (if indicated):  not done  Assets:  Desire for Improvement  ADL's:  Intact  Cognition:wnl  Sleep:  Fair   Screenings:   Assessment and Plan: as follows  MDD moderate recurrent: with underlying Grief and anxiety mixed symptoms: have benefitted from wellbutrin, will start small dose of wellbutrin ,   Panic attacks/ fear and phobias: refer to therapy to deal with fears and she may plan to do EMDR Takes klonopine prn Will see effect of wellbutrin on depression and if it helps underlying anxiety  Have discussed options of SSRI, gene testing or other meds if needed Work on distraction and small goals outside comfort zone   FU 3 weeks or earlier if needed   I discussed the assessment and treatment plan with the patient. The patient was provided an opportunity to ask questions and all were answered. The patient agreed with the plan and demonstrated an understanding of the instructions.   The patient was advised to call back or seek an in-person evaluation if the symptoms worsen or if the condition fails to improve as anticipated.  I provided 40  minutes of non-face-to-face time during this encounter.  Merian Capron, MD 10/11/202111:37 AM

## 2020-05-14 ENCOUNTER — Telehealth (HOSPITAL_COMMUNITY): Payer: BLUE CROSS/BLUE SHIELD | Admitting: Psychiatry

## 2020-07-11 DIAGNOSIS — U071 COVID-19: Secondary | ICD-10-CM | POA: Insufficient documentation

## 2020-07-11 HISTORY — DX: COVID-19: U07.1

## 2020-09-26 ENCOUNTER — Emergency Department
Admission: RE | Admit: 2020-09-26 | Discharge: 2020-09-26 | Disposition: A | Discharge status: Home / Self Care | Payer: 59 | Source: Ambulatory Visit

## 2020-09-26 ENCOUNTER — Other Ambulatory Visit: Payer: Self-pay

## 2020-09-26 DIAGNOSIS — S161XXA Strain of muscle, fascia and tendon at neck level, initial encounter: Secondary | ICD-10-CM

## 2020-09-26 DIAGNOSIS — T148XXA Other injury of unspecified body region, initial encounter: Secondary | ICD-10-CM

## 2020-09-26 DIAGNOSIS — M542 Cervicalgia: Secondary | ICD-10-CM | POA: Diagnosis not present

## 2020-09-26 DIAGNOSIS — M546 Pain in thoracic spine: Secondary | ICD-10-CM | POA: Diagnosis not present

## 2020-09-26 DIAGNOSIS — M545 Low back pain, unspecified: Secondary | ICD-10-CM | POA: Diagnosis not present

## 2020-09-26 MED ORDER — TIZANIDINE HCL 4 MG PO TABS: 4.0000 mg | ORAL_TABLET | Freq: Four times a day (QID) | ORAL | 0 refills | Status: DC | PRN

## 2020-09-26 NOTE — Discharge Instructions (Signed)
You may continue ibuprofen and tylenol as needed for pain  I have sent in tizanidine for you to take 3 times per day as needed for muscle spasms  May continue home Clonazepam for anxiety   You may use heat or ice to sore muscles   You may use topical rubs as needed  You will be sore for a few days, but then should see gradual improvement  Follow up with sports medicine if symptoms are persisting over the next week or so  Go to the ER for severe pain, trouble swallowing, trouble breathing, other concerning symptoms
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
## 2020-12-27 ENCOUNTER — Other Ambulatory Visit: Payer: Self-pay

## 2020-12-27 ENCOUNTER — Emergency Department
Admission: RE | Admit: 2020-12-27 | Discharge: 2020-12-27 | Disposition: A | Discharge status: Home / Self Care | Payer: 59 | Source: Ambulatory Visit

## 2020-12-27 VITALS — BP 119/83 | HR 96 | Temp 98.7°F | Resp 18 | Ht 66.0 in | Wt 138.0 lb

## 2020-12-27 DIAGNOSIS — H9203 Otalgia, bilateral: Secondary | ICD-10-CM | POA: Diagnosis not present

## 2020-12-27 DIAGNOSIS — G43801 Other migraine, not intractable, with status migrainosus: Secondary | ICD-10-CM

## 2020-12-27 MED ORDER — KETOROLAC TROMETHAMINE 30 MG/ML IJ SOLN
30.0000 mg | Freq: Once | INTRAMUSCULAR | Status: AC
  Administered 2020-12-27: 30 mg via INTRAMUSCULAR

## 2020-12-27 NOTE — Discharge Instructions (Addendum)
You have received a toradol injection in the office for pain  Take a maxalt when and benadryl when you get home. The combination should work well altogether  Continue Cefdinir  Follow up with this office or with primary care if symptoms are persisting.  Follow up in the ER for high fever, trouble swallowing, trouble breathing, other concerning symptoms.

## 2020-12-27 NOTE — ED Provider Notes (Signed)
[facility]    CSN: [id number] Arrival date & time: 12/27/20  1449      History   Chief Complaint Chief Complaint  Patient presents with   Otitis Media   Migraine    HPI Madison Morris is a 35 y.o. female.   Reports migraine for the last week. Has been taking Augmentin for otitis media, and started taking Cefdinir with continued ear pain. States that ears are feeling better and that the head pain has been persistent. Has taken ibuprofen and tylenol with little relief. Denies previous symptoms. Has Maxalt at home and states that this has not helped on its own. States that she is also currently taking prednisone for the ear infection as well. Denies fever, abdominal pain, nausea, vomiting, diarrhea, rash, other symptoms.  ROS per HPI  The history is provided by the patient.   Past Medical History:  Diagnosis Date   Anxiety    Asthma    COVID 07/2020   Depression    H. pylori infection    IBS (irritable bowel syndrome)    Migraine     Patient Active Problem List   Diagnosis Date Noted   Depression 04/20/2020   Unexplained night sweats 05/29/2018   Migraine, unspecified, not intractable, without status migrainosus 12/26/2011   Irritable bowel syndrome without diarrhea 05/19/2011   Anxiety 05/18/2011    Past Surgical History:  Procedure Laterality Date   DILATION AND CURETTAGE OF UTERUS     umbilcal hernia     WISDOM TOOTH EXTRACTION      OB History     Gravida  3   Para  3   Term  3   Preterm      AB      Living  3      SAB      IAB      Ectopic      Multiple      Live Births               Home Medications    Prior to Admission medications   Medication Sig Start Date End Date Taking? Authorizing Provider  albuterol (VENTOLIN HFA) 108 (90 Base) MCG/ACT inhaler Inhale into the lungs. 06/21/13   [provider]  Ascorbic Acid 100 MG CHEW Vitamin C    [provider]  baclofen (LIORESAL) 10 MG tablet Take  10 mg by mouth 3 (three) times daily.    [provider]  beclomethasone (QVAR) 80 MCG/ACT inhaler Place 2 puffs into the nose as needed. Patient not taking: No sig reported    [provider]  buPROPion (WELLBUTRIN SR) 100 MG 12 hr tablet Take 1 tablet (100 mg total) by mouth daily. Patient not taking: No sig reported 04/20/20   Merian Capron, MD  clonazePAM (KLONOPIN) 0.5 MG tablet Take 1 mg by mouth 2 (two) times daily as needed for anxiety.    [provider]  fluticasone (FLONASE) 50 MCG/ACT nasal spray Place into both nostrils daily.    [provider]  Fremanezumab-vfrm (AJOVY [place]) Inject into the skin. Patient not taking: No sig reported    [provider]  hydrOXYzine (ATARAX) 10 MG/5ML syrup Take 5-10 mls PRN headache up to q8 hours 03/19/20   [provider]  Hyoscyamine Sulfate (HYOSCYAMINE PO) Take by mouth.    [provider]  lansoprazole (PREVACID) 30 MG capsule  10/23/19   [provider]  Loratadine 10 MG CAPS Take by mouth.  [provider]  Magnesium Carbonate (MAGNESIUM GLUCONATE) 54mg /7ml syringe Take by mouth. Patient not taking: Reported on 09/26/2020    [provider]  meclizine (ANTIVERT) 25 MG tablet TAKE ONE TABLET BY MOUTH 3 TIMES A DAY AS NEEDED FOR UP TO 10 DAYS. 09/08/20   [provider]  montelukast (SINGULAIR) 10 MG tablet Take 10 mg by mouth at bedtime. Patient not taking: No sig reported    [provider]  Multiple Vitamins-Minerals (MULTIVITAMIN GUMMIES WOMENS PO) Take by mouth.    [provider]  ondansetron (ZOFRAN) 4 MG tablet Take 4 mg by mouth every 8 (eight) hours as needed for nausea or vomiting.    [provider]  Probiotic Product (PROBIOTIC-10 PO) Take 1 tablet by mouth daily.    [provider]  Rimegepant Sulfate (NURTEC) 75 MG TBDP Take by mouth. Patient not taking: No sig reported    [provider]   rizatriptan (MAXALT) 10 MG tablet Take 10 mg by mouth as needed for migraine. May repeat in 2 hours if needed    [provider]  tiZANidine (ZANAFLEX) 4 MG tablet Take 1 tablet (4 mg total) by mouth every 6 (six) hours as needed for muscle spasms. 09/26/20   Faustino Congress, NP  topiramate (TOPAMAX) 25 MG capsule Take 25 mg by mouth 2 (two) times daily. Patient not taking: No sig reported    [provider]  vitamin B-12 (CYANOCOBALAMIN) 100 MCG tablet Vitamin B-12    [provider]  VITAMIN D PO Take 1 tablet by mouth daily.    [provider]    Family History Family History  Problem Relation Age of Onset   Pulmonary Hypertension Mother    Asthma Father     Social History Social History   Tobacco Use   Smoking status: Never   Smokeless tobacco: Never  Vaping Use   Vaping Use: Never used  Substance Use Topics   Alcohol use: No   Drug use: No     Allergies   Sulfamethoxazole-trimethoprim and Retin-a [tretinoin]   Review of Systems Review of Systems   Physical Exam Triage Vital Signs ED Triage Vitals  Enc Vitals Group     BP 12/27/20 1518 119/83     Pulse Rate 12/27/20 1518 96     Resp 12/27/20 1518 18     Temp 12/27/20 1518 98.7 F (37.1 C)     Temp Source 12/27/20 1518 Oral     SpO2 12/27/20 1518 99 %     Weight 12/27/20 1511 138 lb (62.6 kg)     Height 12/27/20 1511 5\' 6"  (1.676 m)     Head Circumference --      Peak Flow --      Pain Score 12/27/20 1511 7     Pain Loc --      Pain Edu? --      Excl. in [place]? --    No data found.  Updated Vital Signs BP 119/83 (BP Location: Right Arm)   Pulse 96   Temp 98.7 F (37.1 C) (Oral)   Resp 18   Ht 5\' 6"  (1.676 m)   Wt 138 lb (62.6 kg)   LMP 12/22/2020   SpO2 99%   BMI 22.27 kg/m   Visual Acuity Right Eye Distance:   Left Eye Distance:   Bilateral Distance:    Right Eye Near:   Left Eye Near:    Bilateral Near:     Physical Exam Vitals  and nursing  note reviewed.  Constitutional:      General: She is not in acute distress.    Appearance: Normal appearance. She is well-developed.  HENT:     Head: Normocephalic and atraumatic.     Nose: Nose normal.     Mouth/Throat:     Mouth: Mucous membranes are moist.     Pharynx: Oropharynx is clear.  Eyes:     Extraocular Movements: Extraocular movements intact.     Conjunctiva/sclera: Conjunctivae normal.     Pupils: Pupils are equal, round, and reactive to light.  Cardiovascular:     Rate and Rhythm: Normal rate and regular rhythm.  Pulmonary:     Effort: Pulmonary effort is normal. No respiratory distress.  Musculoskeletal:        General: Normal range of motion.     Cervical back: Normal range of motion and neck supple.  Skin:    General: Skin is warm and dry.     Capillary Refill: Capillary refill takes less than 2 seconds.  Neurological:     General: No focal deficit present.     Mental Status: She is alert and oriented to person, place, and time.  Psychiatric:        Mood and Affect: Mood normal.        Behavior: Behavior normal.        Thought Content: Thought content normal.     UC Treatments / Results  Labs (all labs ordered are listed, but only abnormal results are displayed) Labs Reviewed - No data to display  EKG   Radiology No results found.  Procedures Procedures (including critical care time)  Medications Ordered in UC Medications  ketorolac (TORADOL) 30 MG/ML injection 30 mg (30 mg Intramuscular Given 12/27/20 1555)    Initial Impression / Assessment and Plan / UC Course  I have reviewed the triage vital signs and the nursing notes.  Pertinent labs & imaging results that were available during my care of the patient were reviewed by me and considered in my medical decision making (see chart for details).    Migraine Bilateral Otalgia  Toradol 30mg  IM in office today Try maxalt and benadryl when you get home Drink plenty of fluids, get  rest Follow up with this office or with primary care if symptoms are persisting.  Follow up in the ER for high fever, trouble swallowing, trouble breathing, other concerning symptoms.   Final Clinical Impressions(s) / UC Diagnoses   Final diagnoses:  Other migraine with status migrainosus, not intractable  Acute otalgia, bilateral     Discharge Instructions      You have received a toradol injection in the office for pain  Take a maxalt when and benadryl when you get home. The combination should work well altogether  Continue Cefdinir  Follow up with this office or with primary care if symptoms are persisting.  Follow up in the ER for high fever, trouble swallowing, trouble breathing, other concerning symptoms.       ED Prescriptions   None    PDMP not reviewed this encounter.   Faustino Congress, NP 12/28/20 1428

## 2020-12-27 NOTE — ED Triage Notes (Signed)
Pt presents to Urgent Care with c/o continued bil ear pain (and "popping" 2 days ago) and migraine headaches x approx 2 weeks. Pt reports being on antibiotic and just finished course of prednisone.

## 2021-03-13 ENCOUNTER — Emergency Department (INDEPENDENT_AMBULATORY_CARE_PROVIDER_SITE_OTHER): Payer: 59

## 2021-03-13 ENCOUNTER — Emergency Department
Admission: EM | Admit: 2021-03-13 | Discharge: 2021-03-13 | Disposition: A | Discharge status: Home / Self Care | Payer: 59 | Source: Home / Self Care | Attending: Family Medicine | Admitting: Family Medicine

## 2021-03-13 ENCOUNTER — Other Ambulatory Visit: Payer: Self-pay

## 2021-03-13 DIAGNOSIS — M5412 Radiculopathy, cervical region: Secondary | ICD-10-CM

## 2021-03-13 DIAGNOSIS — M79622 Pain in left upper arm: Secondary | ICD-10-CM | POA: Diagnosis not present

## 2021-03-13 MED ORDER — PREDNISONE 20 MG PO TABS: 20.0000 mg | ORAL_TABLET | Freq: Two times a day (BID) | ORAL | 0 refills | Status: DC

## 2021-03-13 NOTE — Discharge Instructions (Addendum)
Take the prednisone 2 times a day.  Take 2 doses today This is an anti-inflammatory.  We will help take down the unusual feeling in your arm Avoid heavy and overhead activities for a couple of days Call your doctor if not improving by Monday
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
## 2021-04-25 ENCOUNTER — Emergency Department (INDEPENDENT_AMBULATORY_CARE_PROVIDER_SITE_OTHER)
Admission: RE | Admit: 2021-04-25 | Discharge: 2021-04-25 | Disposition: A | Discharge status: Home / Self Care | Payer: 59 | Source: Ambulatory Visit | Attending: Family Medicine | Admitting: Family Medicine

## 2021-04-25 ENCOUNTER — Other Ambulatory Visit: Payer: Self-pay

## 2021-04-25 VITALS — BP 136/86 | HR 78 | Temp 97.9°F | Resp 16

## 2021-04-25 DIAGNOSIS — G43819 Other migraine, intractable, without status migrainosus: Secondary | ICD-10-CM

## 2021-04-25 MED ORDER — KETOROLAC TROMETHAMINE 60 MG/2ML IM SOLN
60.0000 mg | Freq: Once | INTRAMUSCULAR | Status: AC
  Administered 2021-04-25: 60 mg via INTRAMUSCULAR

## 2021-04-25 NOTE — Discharge Instructions (Addendum)
We gave you a shot of Toradol.  Go home and rest. Follow-up with your primary care doctor or neurologist if the headaches persist

## 2021-04-25 NOTE — ED Provider Notes (Signed)
[facility]    CSN: [id number] Arrival date & time: 04/25/21  0847      History   Chief Complaint Chief Complaint  Patient presents with   Appointment    9:00 AM   Migraine    HPI Madison Morris is a 35 y.o. female.   HPI  Patient is here with a migraine.  She states she is prone to migraines.  Her current migraines been going on for 3 days.  She states has been there every day for 3 days.  She is not having any luck with her usual over-the-counter medicines.  She has taken Tylenol, ibuprofen, Maxalt, Klonopin, and Zofran.  She has some nausea but no vomiting  Past Medical History:  Diagnosis Date   Anxiety    Asthma    COVID 07/2020   Depression    H. pylori infection    IBS (irritable bowel syndrome)    Migraine     Patient Active Problem List   Diagnosis Date Noted   Depression 04/20/2020   Unexplained night sweats 05/29/2018   Migraine, unspecified, not intractable, without status migrainosus 12/26/2011   Irritable bowel syndrome without diarrhea 05/19/2011   Anxiety 05/18/2011    Past Surgical History:  Procedure Laterality Date   DILATION AND CURETTAGE OF UTERUS     umbilcal hernia     WISDOM TOOTH EXTRACTION      OB History     Gravida  3   Para  3   Term  3   Preterm      AB      Living  3      SAB      IAB      Ectopic      Multiple      Live Births               Home Medications    Prior to Admission medications   Medication Sig Start Date End Date Taking? Authorizing Provider  albuterol (VENTOLIN HFA) 108 (90 Base) MCG/ACT inhaler Inhale into the lungs. 06/21/13  Yes [provider]  Ascorbic Acid 100 MG CHEW Vitamin C   Yes [provider]  clonazePAM (KLONOPIN) 0.5 MG tablet Take 1 mg by mouth 2 (two) times daily as needed for anxiety.   Yes [provider]  fluticasone (FLONASE) 50 MCG/ACT nasal spray Place into both nostrils daily.   Yes [provider]  hydrOXYzine  (ATARAX) 10 MG/5ML syrup Take 5-10 mls PRN headache up to q8 hours 03/19/20  Yes [provider]  Hyoscyamine Sulfate (HYOSCYAMINE PO) Take by mouth.   Yes [provider]  lansoprazole (PREVACID) 30 MG capsule  10/23/19  Yes [provider]  Loratadine 10 MG CAPS Take by mouth.   Yes [provider]  meclizine (ANTIVERT) 25 MG tablet TAKE ONE TABLET BY MOUTH 3 TIMES A DAY AS NEEDED FOR UP TO 10 DAYS. 09/08/20  Yes [provider]  Multiple Vitamins-Minerals (MULTIVITAMIN GUMMIES WOMENS PO) Take by mouth.   Yes [provider]  ondansetron (ZOFRAN) 4 MG tablet Take 4 mg by mouth every 8 (eight) hours as needed for nausea or vomiting.   Yes [provider]  Probiotic Product (PROBIOTIC-10 PO) Take 1 tablet by mouth daily.   Yes [provider]  rizatriptan (MAXALT) 10 MG tablet Take 10 mg by mouth as needed for migraine. May repeat in 2 hours if needed   Yes [provider]  vitamin B-12 (  CYANOCOBALAMIN) 100 MCG tablet Vitamin B-12   Yes [provider]  VITAMIN D PO Take 1 tablet by mouth daily.   Yes [provider]    Family History Family History  Problem Relation Age of Onset   Pulmonary Hypertension Mother    Asthma Father     Social History Social History   Tobacco Use   Smoking status: Never   Smokeless tobacco: Never  Vaping Use   Vaping Use: Never used  Substance Use Topics   Alcohol use: No   Drug use: No     Allergies   Tretinoin and Sulfamethoxazole-trimethoprim   Review of Systems Review of Systems  See HPI Physical Exam Triage Vital Signs ED Triage Vitals  Enc Vitals Group     BP 04/25/21 0859 136/86     Pulse Rate 04/25/21 0859 78     Resp 04/25/21 0859 16     Temp 04/25/21 0859 97.9 F (36.6 C)     Temp Source 04/25/21 0859 Oral     SpO2 04/25/21 0859 95 %     Weight --      Height --      Head Circumference --      Peak Flow --      Pain Score  04/25/21 0855 6     Pain Loc --      Pain Edu? --      Excl. in [place]? --    No data found.  Updated Vital Signs BP 136/86 (BP Location: Right Arm)   Pulse 78   Temp 97.9 F (36.6 C) (Oral)   Resp 16   SpO2 95%      Physical Exam Constitutional:      General: She is not in acute distress.    Appearance: Normal appearance. She is well-developed.  HENT:     Head: Normocephalic and atraumatic.     Mouth/Throat:     Comments: Mask is in place Eyes:     Conjunctiva/sclera: Conjunctivae normal.     Pupils: Pupils are equal, round, and reactive to light.  Cardiovascular:     Rate and Rhythm: Normal rate.  Pulmonary:     Effort: Pulmonary effort is normal. No respiratory distress.  Abdominal:     General: There is no distension.     Palpations: Abdomen is soft.  Musculoskeletal:        General: Normal range of motion.     Cervical back: Normal range of motion.  Skin:    General: Skin is warm and dry.  Neurological:     Mental Status: She is alert.     Comments: No focal neuro findings  Psychiatric:        Mood and Affect: Mood normal.        Behavior: Behavior normal.     UC Treatments / Results  Labs (all labs ordered are listed, but only abnormal results are displayed) Labs Reviewed - No data to display  EKG   Radiology No results found.  Procedures Procedures (including critical care time)  Medications Ordered in UC Medications  ketorolac (TORADOL) injection 60 mg (60 mg Intramuscular Given 04/25/21 0934)    Initial Impression / Assessment and Plan / UC Course  I have reviewed the triage vital signs and the nursing notes.  Pertinent labs & imaging results that were available during my care of the patient were reviewed by me and considered in my medical decision making (see chart for details).     Patient  has had good luck with Toradol in the past. Final Clinical Impressions(s) / UC Diagnoses   Final diagnoses:  Other migraine without status  migrainosus, intractable     Discharge Instructions      We gave you a shot of Toradol.  Go home and rest. Follow-up with your primary care doctor or neurologist if the headaches persist   ED Prescriptions   None    PDMP not reviewed this encounter.   Raylene Everts, MD 04/25/21 [id number]

## 2021-04-25 NOTE — ED Triage Notes (Signed)
Patient presents to Urgent Care with complaints of migraine since 1 week ago. Patient reports having sensitivity to light and sound and right ear pain. Medications has not helped. Has taken Tylenol, Ibuprofen, Maxalt, Klonopin, and Zofran. Migraines have worsened since having Covid about 6 weeks ago. Some nausea.

## 2021-06-18 ENCOUNTER — Ambulatory Visit: Payer: Self-pay

## 2021-07-12 ENCOUNTER — Emergency Department: Admit: 2021-07-12 | Discharge status: Absent without leave | Payer: Self-pay

## 2021-07-12 ENCOUNTER — Ambulatory Visit: Payer: Self-pay

## 2021-07-12 DIAGNOSIS — R1084 Generalized abdominal pain: Secondary | ICD-10-CM | POA: Diagnosis not present

## 2021-07-12 DIAGNOSIS — R1032 Left lower quadrant pain: Secondary | ICD-10-CM | POA: Diagnosis not present

## 2021-07-12 DIAGNOSIS — R1031 Right lower quadrant pain: Secondary | ICD-10-CM | POA: Diagnosis not present

## 2021-07-12 DIAGNOSIS — R1012 Left upper quadrant pain: Secondary | ICD-10-CM | POA: Diagnosis not present

## 2021-07-12 DIAGNOSIS — R300 Dysuria: Secondary | ICD-10-CM | POA: Diagnosis not present

## 2021-07-12 DIAGNOSIS — K5900 Constipation, unspecified: Secondary | ICD-10-CM | POA: Diagnosis not present

## 2021-07-14 DIAGNOSIS — R69 Illness, unspecified: Secondary | ICD-10-CM | POA: Diagnosis not present

## 2021-07-14 DIAGNOSIS — F411 Generalized anxiety disorder: Secondary | ICD-10-CM | POA: Diagnosis not present

## 2021-07-19 DIAGNOSIS — M542 Cervicalgia: Secondary | ICD-10-CM | POA: Diagnosis not present

## 2021-07-19 DIAGNOSIS — G43709 Chronic migraine without aura, not intractable, without status migrainosus: Secondary | ICD-10-CM | POA: Diagnosis not present

## 2021-07-21 DIAGNOSIS — F411 Generalized anxiety disorder: Secondary | ICD-10-CM | POA: Diagnosis not present

## 2021-07-21 DIAGNOSIS — R69 Illness, unspecified: Secondary | ICD-10-CM | POA: Diagnosis not present

## 2021-08-04 DIAGNOSIS — R69 Illness, unspecified: Secondary | ICD-10-CM | POA: Diagnosis not present

## 2021-08-04 DIAGNOSIS — F411 Generalized anxiety disorder: Secondary | ICD-10-CM | POA: Diagnosis not present

## 2021-08-11 DIAGNOSIS — R69 Illness, unspecified: Secondary | ICD-10-CM | POA: Diagnosis not present

## 2021-08-11 DIAGNOSIS — F411 Generalized anxiety disorder: Secondary | ICD-10-CM | POA: Diagnosis not present

## 2021-08-13 ENCOUNTER — Encounter: Payer: Self-pay | Admitting: Medical-Surgical

## 2021-08-13 ENCOUNTER — Other Ambulatory Visit: Payer: Self-pay

## 2021-08-13 ENCOUNTER — Ambulatory Visit (INDEPENDENT_AMBULATORY_CARE_PROVIDER_SITE_OTHER): Payer: 59 | Admitting: Medical-Surgical

## 2021-08-13 VITALS — BP 107/79 | HR 79 | Resp 20 | Ht 66.0 in | Wt 134.9 lb

## 2021-08-13 DIAGNOSIS — K589 Irritable bowel syndrome without diarrhea: Secondary | ICD-10-CM | POA: Diagnosis not present

## 2021-08-13 DIAGNOSIS — U099 Post covid-19 condition, unspecified: Secondary | ICD-10-CM

## 2021-08-13 DIAGNOSIS — F329 Major depressive disorder, single episode, unspecified: Secondary | ICD-10-CM

## 2021-08-13 DIAGNOSIS — G43909 Migraine, unspecified, not intractable, without status migrainosus: Secondary | ICD-10-CM

## 2021-08-13 DIAGNOSIS — F419 Anxiety disorder, unspecified: Secondary | ICD-10-CM | POA: Diagnosis not present

## 2021-08-13 DIAGNOSIS — N926 Irregular menstruation, unspecified: Secondary | ICD-10-CM | POA: Diagnosis not present

## 2021-08-13 DIAGNOSIS — R413 Other amnesia: Secondary | ICD-10-CM

## 2021-08-13 DIAGNOSIS — R69 Illness, unspecified: Secondary | ICD-10-CM | POA: Diagnosis not present

## 2021-08-13 NOTE — Progress Notes (Signed)
Medical screening examination/treatment was performed by nurse practitioner student and as supervising provider I was immediately available for consultation/collaboration. I have reviewed documentation and agree with assessment and plan.  Clearnce Sorrel, DNP, APRN, FNP-BC [facility]

## 2021-08-13 NOTE — Progress Notes (Signed)
New Patient Office Visit  Subjective:  Patient ID: Madison Morris, female    DOB: [birth year]  Age: 36 y.o. MRN: [id number]  CC: Establish care   HPI Pleasant 36 y.o. female presenting to establish care. Sees a neurologist for her migraines. Started botox as a preventative for migraines. States it is helping, she will go 7-13 days  without a migraine and she states that is good for her. Complaints of irregular periods and on and off night sweats that occur with her period. She reports have irregular periods before she had her youngest daughter who is now eight and that is when the night sweats started. Sometimes period will start 21-31 days or sometimes she will have two in one month. Reports being seen by a gynecologist years ago for this concern and was diagnosed with adenomyosis. Not on birth control d/t insurance change and would like to be referred to [facility].   Reports being on Klonopin for the past ten years. She said she has "tried everything" and went through gene site testing and stated that klonopin was one of the only ones that would work. She was recently increased to 1mg  by previous doctor and she states she takes it maybe 2-3 times a week when her anxiety is really bad. She tries other remedies before taking it such as "sour candy." Denies side effects at this time.  Reports long-covid symptoms. Had Covid for a second time in September and reports her memory has been bad and she has been forgetting things or struggling to remember short term things. She stated she will say something and have no idea that she said it because her husband or children will ask what she said and she cannot recall saying anything. She also has a history of IBS and states after having covid in September her constipation has been worse. She states she hasn't had the best appetite and she struggles to meet her water intake for the day and she used to not struggle with that.   Past Medical History:  Diagnosis Date    Allergy    Mold, dust mites, [place] grass, gluten   Anemia July 2014   I believe I don't have the problem anymore   Anxiety    Arthritis March 2018   At least in my knees, noted after accident   Asthma    COVID 07/2020   Depression    GERD (gastroesophageal reflux disease)    H. pylori infection    IBS (irritable bowel syndrome)    Migraine     Past Surgical History:  Procedure Laterality Date   DILATION AND CURETTAGE OF UTERUS     HERNIA REPAIR  Augest 2018   Repaired, still have issues with it today   umbilcal hernia     WISDOM TOOTH EXTRACTION      Family History  Problem Relation Age of Onset   Pulmonary Hypertension Mother    Early death Mother    Hypertension Mother    Asthma Father    Diabetes Maternal Grandmother    Hearing loss Maternal Grandmother    Cancer Paternal Grandfather    Anxiety disorder Daughter    Depression Brother     Social History   Socioeconomic History   Marital status: Married    Spouse name: Not on file   Number of children: Not on file   Years of education: Not on file   Highest education level: Not on file  Occupational History   Occupation: homemaker  Tobacco Use   Smoking status: Never   Smokeless tobacco: Never  Vaping Use   Vaping Use: Never used  Substance and Sexual Activity   Alcohol use: No   Drug use: No   Sexual activity: Yes    Partners: Male    Birth control/protection: None    Comment: vasectomy  Other Topics Concern   Not on file  Social History Narrative   Not on file   Social Determinants of Health   Financial Resource Strain: Not on file  Food Insecurity: Not on file  Transportation Needs: Not on file  Physical Activity: Not on file  Stress: Not on file  Social Connections: Not on file  Intimate Partner Violence: Not on file      Objective:   Today's Vitals: BP 107/79 (BP Location: Left Arm, Patient Position: Sitting, Cuff Size: Normal)    Pulse 79    Resp 20    Ht 5\' 6"  (1.676 m)     Wt 61.2 kg    SpO2 100%    BMI 21.77 kg/m   Physical Exam Constitutional:      Appearance: Normal appearance.  HENT:     Head: Normocephalic and atraumatic.  Pulmonary:     Effort: Pulmonary effort is normal.  Skin:    General: Skin is warm and dry.  Neurological:     Mental Status: She is alert and oriented to person, place, and time.  Psychiatric:        Mood and Affect: Mood normal.        Behavior: Behavior normal.    Assessment & Plan:   1. Anxiety 2. Major depressive disorder with current active episode, unspecified depression episode severity, unspecified whether recurrent - Doing well on Klonopin -Sees a counselor for the last couple of years -No complaints at this time  3. Irritable bowel syndrome without diarrhea - Doing well on Hyoscyamine, continue med regimen -Was previously seeing a Digestive specialist, encourage to seek out new digestive specialist  with new insurance  4. Migraine without status migrainosus, not intractable, unspecified migraine type -Sees neurologist regularly - Uses botox for preventative treatment, states this is helping. No concerns at this time. -Would like referral to OBGYN to discuss birth control options to help with migraines  5. Irregular menses - history of irregular periods and hx of adenomyosis -Would like referral to OBGYN to discuss options - Ambulatory referral to Obstetrics / Gynecology  6. Long COVID 7. Memory loss - Discussed long covid symptoms that can linger for a couple of week, months, or even years. -Encouraged to monitor symptoms and discuss with neurology at next appointment     Outpatient Encounter Medications as of 08/13/2021  Medication Sig   albuterol (VENTOLIN HFA) 108 (90 Base) MCG/ACT inhaler Inhale into the lungs.   Botulinum Toxin Type A (BOTOX) 200 units SOLR Inject 200u IM every 84 days forehead/neck muscles   clonazePAM (KLONOPIN) 1 MG disintegrating tablet Take 1 mg by mouth 3 (three) times daily  as needed.   fluticasone (FLONASE) 50 MCG/ACT nasal spray Place into both nostrils daily.   gabapentin (NEURONTIN) 250 MG/5ML solution Take by mouth.   Hyoscyamine Sulfate (HYOSCYAMINE PO) Take by mouth.   lansoprazole (PREVACID) 30 MG capsule    Loratadine 10 MG CAPS Take by mouth.   Multiple Vitamins-Minerals (MULTIVITAMIN GUMMIES WOMENS PO) Take by mouth.   ondansetron (ZOFRAN) 4 MG tablet Take 4 mg by mouth every 8 (eight) hours as needed for nausea or vomiting.   [  DISCONTINUED] Ascorbic Acid 100 MG CHEW Vitamin C   [DISCONTINUED] clonazePAM (KLONOPIN) 0.5 MG tablet Take 1 mg by mouth 2 (two) times daily as needed for anxiety.   [DISCONTINUED] hydrOXYzine (ATARAX) 10 MG/5ML syrup Take 5-10 mls PRN headache up to q8 hours   [DISCONTINUED] meclizine (ANTIVERT) 25 MG tablet TAKE ONE TABLET BY MOUTH 3 TIMES A DAY AS NEEDED FOR UP TO 10 DAYS.   [DISCONTINUED] Probiotic Product (PROBIOTIC-10 PO) Take 1 tablet by mouth daily.   [DISCONTINUED] rizatriptan (MAXALT) 10 MG tablet Take 10 mg by mouth as needed for migraine. May repeat in 2 hours if needed   [DISCONTINUED] vitamin B-12 (CYANOCOBALAMIN) 100 MCG tablet Vitamin B-12   [DISCONTINUED] VITAMIN D PO Take 1 tablet by mouth daily.   No facility-administered encounter medications on file as of 08/13/2021.    Follow-up: Return for annual physical exam at your convenience; 6 months for chronic disease follow up.   Jeanann Lewandowsky, Student FNP

## 2021-08-18 DIAGNOSIS — G43709 Chronic migraine without aura, not intractable, without status migrainosus: Secondary | ICD-10-CM | POA: Diagnosis not present

## 2021-08-18 DIAGNOSIS — M542 Cervicalgia: Secondary | ICD-10-CM | POA: Diagnosis not present

## 2021-08-19 DIAGNOSIS — F411 Generalized anxiety disorder: Secondary | ICD-10-CM | POA: Diagnosis not present

## 2021-08-19 DIAGNOSIS — R69 Illness, unspecified: Secondary | ICD-10-CM | POA: Diagnosis not present

## 2021-08-25 DIAGNOSIS — F411 Generalized anxiety disorder: Secondary | ICD-10-CM | POA: Diagnosis not present

## 2021-08-25 DIAGNOSIS — R69 Illness, unspecified: Secondary | ICD-10-CM | POA: Diagnosis not present

## 2021-09-03 DIAGNOSIS — F411 Generalized anxiety disorder: Secondary | ICD-10-CM | POA: Diagnosis not present

## 2021-09-03 DIAGNOSIS — R69 Illness, unspecified: Secondary | ICD-10-CM | POA: Diagnosis not present

## 2021-09-07 DIAGNOSIS — M546 Pain in thoracic spine: Secondary | ICD-10-CM | POA: Diagnosis not present

## 2021-09-07 DIAGNOSIS — M9902 Segmental and somatic dysfunction of thoracic region: Secondary | ICD-10-CM | POA: Diagnosis not present

## 2021-09-07 DIAGNOSIS — M542 Cervicalgia: Secondary | ICD-10-CM | POA: Diagnosis not present

## 2021-09-07 DIAGNOSIS — M9907 Segmental and somatic dysfunction of upper extremity: Secondary | ICD-10-CM | POA: Diagnosis not present

## 2021-09-07 DIAGNOSIS — M25512 Pain in left shoulder: Secondary | ICD-10-CM | POA: Diagnosis not present

## 2021-09-07 DIAGNOSIS — M9901 Segmental and somatic dysfunction of cervical region: Secondary | ICD-10-CM | POA: Diagnosis not present

## 2021-09-08 DIAGNOSIS — F411 Generalized anxiety disorder: Secondary | ICD-10-CM | POA: Diagnosis not present

## 2021-09-08 DIAGNOSIS — R69 Illness, unspecified: Secondary | ICD-10-CM | POA: Diagnosis not present

## 2021-09-10 DIAGNOSIS — M25512 Pain in left shoulder: Secondary | ICD-10-CM | POA: Diagnosis not present

## 2021-09-10 DIAGNOSIS — M9902 Segmental and somatic dysfunction of thoracic region: Secondary | ICD-10-CM | POA: Diagnosis not present

## 2021-09-10 DIAGNOSIS — M542 Cervicalgia: Secondary | ICD-10-CM | POA: Diagnosis not present

## 2021-09-10 DIAGNOSIS — M9907 Segmental and somatic dysfunction of upper extremity: Secondary | ICD-10-CM | POA: Diagnosis not present

## 2021-09-10 DIAGNOSIS — M546 Pain in thoracic spine: Secondary | ICD-10-CM | POA: Diagnosis not present

## 2021-09-10 DIAGNOSIS — M9901 Segmental and somatic dysfunction of cervical region: Secondary | ICD-10-CM | POA: Diagnosis not present

## 2021-09-13 ENCOUNTER — Encounter: Payer: Self-pay | Admitting: Obstetrics and Gynecology

## 2021-09-13 ENCOUNTER — Other Ambulatory Visit: Payer: Self-pay

## 2021-09-13 ENCOUNTER — Ambulatory Visit: Payer: 59 | Admitting: Obstetrics and Gynecology

## 2021-09-13 VITALS — BP 117/73 | HR 96 | Ht 66.0 in | Wt 135.0 lb

## 2021-09-13 DIAGNOSIS — N939 Abnormal uterine and vaginal bleeding, unspecified: Secondary | ICD-10-CM

## 2021-09-13 DIAGNOSIS — M9901 Segmental and somatic dysfunction of cervical region: Secondary | ICD-10-CM | POA: Diagnosis not present

## 2021-09-13 DIAGNOSIS — M546 Pain in thoracic spine: Secondary | ICD-10-CM | POA: Diagnosis not present

## 2021-09-13 DIAGNOSIS — M9902 Segmental and somatic dysfunction of thoracic region: Secondary | ICD-10-CM | POA: Diagnosis not present

## 2021-09-13 DIAGNOSIS — M542 Cervicalgia: Secondary | ICD-10-CM | POA: Diagnosis not present

## 2021-09-13 DIAGNOSIS — M9907 Segmental and somatic dysfunction of upper extremity: Secondary | ICD-10-CM | POA: Diagnosis not present

## 2021-09-13 DIAGNOSIS — M25512 Pain in left shoulder: Secondary | ICD-10-CM | POA: Diagnosis not present

## 2021-09-13 NOTE — Progress Notes (Signed)
? ?GYNECOLOGY OFFICE NOTE ? ?History:  ?36 y.o. [id number] here today for irregular periods.   ? ?Here because PCP recommended she present to GYN. Having periods lasting 2-4 days, cycles lasting 12-31 days. Mostly regular but occasional multiple periods within a month. Has painful periods and occasional menstrual migraines and dizziness with periods. Has been seen for this before and recommended to start birth control but she declined at that point. Unsure if she wants hormonal management now. Not overly bothered by bleeding but is considering management.  ? ?Has migraines with aura for which she has gotten Botox.  ? ?Past Medical History:  ?Diagnosis Date  ? Allergy   ? Mold, dust mites, [place] grass, gluten  ? Anemia July 2014  ? I believe I don't have the problem anymore  ? Anxiety   ? Arthritis March 2018  ? At least in my knees, noted after accident  ? Asthma   ? COVID 07/2020  ? Depression   ? GERD (gastroesophageal reflux disease)   ? H. pylori infection   ? IBS (irritable bowel syndrome)   ? Migraine   ? ? ?Past Surgical History:  ?Procedure Laterality Date  ? DILATION AND CURETTAGE OF UTERUS    ? HERNIA REPAIR  Augest 2018  ? Repaired, still have issues with it today  ? umbilcal hernia    ? WISDOM TOOTH EXTRACTION    ? ? ? ?Current Outpatient Medications:  ?  albuterol (VENTOLIN HFA) 108 (90 Base) MCG/ACT inhaler, Inhale into the lungs., Disp: , Rfl:  ?  Botulinum Toxin Type A (BOTOX) 200 units SOLR, Inject 200u IM every 84 days forehead/neck muscles, Disp: , Rfl:  ?  clonazePAM (KLONOPIN) 1 MG disintegrating tablet, Take 1 mg by mouth 3 (three) times daily as needed., Disp: , Rfl:  ?  fluticasone (FLONASE) 50 MCG/ACT nasal spray, Place into both nostrils daily., Disp: , Rfl:  ?  gabapentin (NEURONTIN) 250 MG/5ML solution, Take by mouth., Disp: , Rfl:  ?  Hyoscyamine Sulfate (HYOSCYAMINE PO), Take by mouth., Disp: , Rfl:  ?  lansoprazole (PREVACID) 30 MG capsule, , Disp: , Rfl:  ?  Loratadine 10 MG CAPS,  Take by mouth., Disp: , Rfl:  ?  Multiple Vitamins-Minerals (MULTIVITAMIN GUMMIES WOMENS PO), Take by mouth., Disp: , Rfl:  ?  ondansetron (ZOFRAN) 4 MG tablet, Take 4 mg by mouth every 8 (eight) hours as needed for nausea or vomiting., Disp: , Rfl:  ? ?The following portions of the patient's history were reviewed and updated as appropriate: allergies, current medications, past family history, past medical history, past social history, past surgical history and problem list.  ? ?Review of Systems:  ?Pertinent items noted in HPI and remainder of comprehensive ROS otherwise negative. ?  ?Objective:  ?Physical Exam ?BP 117/73   Pulse 96   Ht '5\' 6"'$  (1.676 m)   Wt 135 lb (61.2 kg)   LMP 09/13/2021   BMI 21.79 kg/m?  ?CONSTITUTIONAL: Well-developed, well-nourished female in no acute distress.  ?HENT:  Normocephalic, atraumatic. External right and left ear normal. Oropharynx is clear and moist ?EYES: Conjunctivae and EOM are normal. Pupils are equal, round, and reactive to light. No scleral icterus.  ?NECK: Normal range of motion, supple, no masses ?SKIN: Skin is warm and dry. No rash noted. Not diaphoretic. No erythema. No pallor. ?NEUROLOGIC: Alert and oriented to person, place, and time. Normal reflexes, muscle tone coordination. No cranial nerve deficit noted. ?PSYCHIATRIC: Normal mood and affect. Normal behavior. Normal judgment  and thought content. ?CARDIOVASCULAR: Normal heart rate noted ?RESPIRATORY: Effort normal, no problems with respiration noted ?ABDOMEN: Soft, no distention noted.   ?PELVIC: deferred ?MUSCULOSKELETAL: Normal range of motion. No edema noted. ? ? ?Labs and Imaging ?No results found. ?Had [place] at [facility] last year, unremarkable  ? ?Assessment & Plan:  ?1. Abnormal uterine bleeding (AUB) ?Pt most bothered by menstrual migraines, not overly bothered by bleeding but would be okay with decreased bleeding ?Reviewed methods for controlling ovulation and thereby suppressing menstrual migraines,  limited to progesterone only methods due to migraine with aura, reviewed IUD (mirena and lower dose), slynd, depo. No guarantee of decreased bleeding with low dose IUD but as she reports she is sensitive to medications, may do better than with Mirena.  ?- will plan for EMB at some point but she is low risk for malignancy and bleeding profile sounds ovulatory dysfunction ?- had recent US at [facility]: unremarkable ?- will obtain labs and return for pap, review labs and further discussion as she does not want to start anything today, patient agreeable ?- Follicle stimulating hormone ?- Hemoglobin A1c ?- Prolactin ? ? ?Routine preventative health maintenance measures emphasized. ?Please refer to After Visit Summary for other counseling recommendations.  ? ?Return in about 4 weeks (around 10/11/2021) for Followup, pap, annual. ? ?K. Arvilla Meres, MD, FACOG ?Attending ?[facility] [employer] [facility]) ? ? ? ?

## 2021-09-13 NOTE — Patient Instructions (Signed)
Abnormal Uterine Bleeding Abnormal uterine bleeding is unusual bleeding from the uterus. It includes bleeding after sex, or bleeding or spotting between menstrual periods. It may also include bleeding that is heavier than normal, menstrual periods that last longer than usual, or bleeding that occurs after menopause. Abnormal uterine bleeding can affect teenagers, women in their reproductive years, pregnant women, and women who have reached menopause. Common causes of abnormal uterine bleeding include: Pregnancy. Abnormal growths within the lining of the uterus (polyps). Benign tumors or growths in the uterus (fibroids). These are not cancer. Infection. Cancer. Too much or too little of some hormones in the body (hormonal imbalances). Any type of abnormal bleeding should be checked by a health care provider. Many cases are minor and simple to treat, but others may be more serious. Treatment will depend on the cause of the bleeding and how severe it is. Follow these instructions at home: Medicines Take over-the-counter and prescription medicines only as told by your health care provider. Ask your health care provider about: Taking medicines such as aspirin and ibuprofen. These medicines can thin your blood. Do not take these medicines unless your health care provider tells you to take them. Taking over-the-counter medicines, vitamins, herbs, and supplements. If you were prescribed iron pills, take them as told by your health care provider. Iron pills help to replace iron that your body loses because of this condition. Managing constipation In cases of severe bleeding, you may be asked to increase your iron intake to treat anemia. Doing this may cause constipation. To prevent or treat constipation, you may need to: Drink enough fluid to keep your urine pale yellow. Take over-the-counter or prescription medicines. Eat foods that are high in fiber, such as beans, whole grains, and fresh fruits and  vegetables. Limit foods that are high in fat and processed sugars, such as fried or sweet foods. Activity Alter your activity to decrease bleeding if you need to change your sanitary pad more than one time every 2 hours: Lie in bed with your feet raised (elevated). Place a cold pack on your lower abdomen. Rest as much as possible until the bleeding stops or slows down. General instructions Do not use tampons, douche, or have sex until your health care provider says these things are okay. Change your sanitary pads often. Get regular exams. These include pelvic exams and cervical cancer screenings. It is up to you to get the results of any tests that are done. Ask your health care provider, or the department that is doing the tests, when your results will be ready. Monitor your condition for any changes. For 2 months, write down: When your menstrual period starts. When your menstrual period ends. When any abnormal vaginal bleeding occurs. What problems you notice. Keep all follow-up visits. This is important. Contact a health care provider if: You have bleeding that lasts for more than one week. You feel dizzy at times. You feel nauseous or you vomit. You feel light-headed or weak. You notice any other changes that show that your condition is getting worse. Get help right away if: You faint. You have bleeding that soaks through a sanitary pad every hour. You have pain in the abdomen. You have a fever or chills. You become sweaty or weak. You pass large blood clots from your vagina. These symptoms may represent a serious problem that is an emergency. Do not wait to see if the symptoms will go away. Get medical help right away. Call your local emergency services (911  in the [place] Do not drive yourself to the hospital. Summary Abnormal uterine bleeding is unusual bleeding from the uterus. Any type of abnormal bleeding should be checked by a health care provider. Many cases are minor and  simple to treat, but others may be more serious. Treatment will depend on the cause of the bleeding and how severe it is. Get help right away if you faint, you have bleeding that soaks through a sanitary pad every hour, or you pass large blood clots from your vagina. This information is not intended to replace advice given to you by your health care provider. Make sure you discuss any questions you have with your health care provider. Document Revised: 10/27/2020 Document Reviewed: 10/27/2020 Elsevier Patient Education  [place].   Intrauterine Device Information An intrauterine device (IUD) is a medical device that is inserted into the uterus to prevent pregnancy. It is a small, T-shaped device that has one or two nylon strings hanging down from it. The strings hang out of the lower part of the uterus (cervix) to allow for future IUD removal. There are two types of IUDs: Hormone IUD. This type of IUD is made of plastic and contains the hormone progestin (synthetic progesterone). A hormone IUD may last 3-5 years. Copper IUD. This type of IUD has copper wire wrapped around it. A copper IUD may last up to 10 years. How is an IUD inserted? An IUD is inserted through the vagina, through the cervix, and into the uterus with a minor medical procedure. The procedure for IUD insertion may vary among health care providers and hospitals. How does an IUD work? Synthetic progesterone in a hormonal IUD prevents pregnancy by: Thickening cervical mucus to prevent sperm from entering the uterus. Thinning the uterine lining to prevent a fertilized egg from being implanted there. Copper in a copper IUD prevents pregnancy by making the uterus and fallopian tubes produce a fluid that kills sperm. What are the advantages of an IUD? Advantages of either type of IUD An IUD: Is highly effective in preventing pregnancy. Is reversible. You can become pregnant shortly after the IUD is removed. Is  low-maintenance and can stay in place for a long time. Has no estrogen-related side effects. Can be used when breastfeeding. Is not associated with weight gain. Can be inserted right after childbirth, an abortion, or a miscarriage. Advantages of a hormone IUD If it is inserted within 7 days of your period starting, it works right after it has been inserted. If the hormone IUD is inserted at any other time in your cycle, you will need to use a backup method of birth control for 7 days after insertion. It can make menstrual periods lighter or stop completely. It can reduce menstrual cramping and other discomforts from menstrual periods. It can be used for 3-5 years, depending on which IUD you have. Advantages of a copper IUD It works right after it is inserted. It can be used as a form of emergency birth control if it is inserted within 5 days after having unprotected sex. It does not interfere with your body's natural hormones. It can be used for up to 10 years. What are the disadvantages of an IUD? An IUD may cause irregular menstrual bleeding for a period of time after insertion. It is common to have pain during insertion and have cramping and vaginal bleeding after insertion. An IUD may cut the uterus (uterine perforation) when it is inserted. This is rare. Pelvic inflammatory disease (PID) may  happen after insertion of an IUD. PID is an infection in the uterus and fallopian tubes. The IUD does not cause the infection. The infection is usually from an unknown sexually transmitted infection (STI). This is rare, and it usually happens during the first 20 days after the IUD is inserted. A copper IUD can make your menstrual flow heavier and more painful. IUDs cannot prevent sexually transmitted infections (STIs). How is an IUD removed?  You will lie on your back with your knees bent and your feet in footrests (stirrups). A device will be inserted into your vagina to spread apart the vaginal  walls (speculum). This will allow your health care provider to see the strings attached to the IUD. Your health care provider will use a small instrument (forceps) to grasp the IUD strings and will pull firmly until the IUD is removed. You may have some discomfort when the IUD is removed. Your health care provider may recommend taking over-the-counter pain relievers, such as ibuprofen, before the procedure. You may also have minor spotting for a few days after the procedure. The procedure for IUD removal may vary among health care providers and hospitals. Is an IUD right for me? If you are interested in an IUD, discuss it with your health care provider. He or she will make sure you are a good candidate for an IUD and will let you know more about the advantages, disadvantage, and possible side effects. This will allow you to make a decision about the device. Summary An intrauterine device (IUD) is a medical device that is inserted in the uterus to prevent pregnancy. It is a small, T-shaped device that has one or two nylon strings hanging down from it. A hormone IUD contains the hormone progestin (synthetic progesterone). A copper IUD has copper wire wrapped around it. Synthetic progesterone in a hormone IUD prevents pregnancy by thickening cervical mucus and thinning the walls of the uterus. Copper in a copper IUD prevents pregnancy by making the uterus and fallopian tubes produce a fluid that kills sperm. A hormone IUD can be left in place for 3-5 years. A copper IUD can be left in place for up to 10 years. An IUD is inserted and removed by a health care provider. You may feel some pain during insertion and removal. Your health care provider may recommend taking over-the-counter pain medicine, such as ibuprofen, before an IUD procedure. This information is not intended to replace advice given to you by your health care provider. Make sure you discuss any questions you have with your health care  provider. Document Revised: 01/08/2020 Document Reviewed: 01/08/2020 Elsevier Patient Education  [place].    Use the following websites (and others) to help learn more about your contraception options and find the method that is right for you!  - The Centers for Disease Control (CDC) website: Momentum[employer].be  - [facility] website: https://www.[facility]parenthood.org/learn/birth-control  - [employer].org: https://www.bedsider.org/methods

## 2021-09-14 LAB — FOLLICLE STIMULATING HORMONE: FSH: 10.1 m[IU]/mL

## 2021-09-14 LAB — HEMOGLOBIN A1C
Hgb A1c MFr Bld: 5.1 % of total Hgb (ref <5.7)
Mean Plasma Glucose: 100 mg/dL
eAG (mmol/L): 5.5 mmol/L

## 2021-09-14 LAB — PROLACTIN: Prolactin: 8 ng/mL

## 2021-09-15 DIAGNOSIS — G43709 Chronic migraine without aura, not intractable, without status migrainosus: Secondary | ICD-10-CM | POA: Diagnosis not present

## 2021-09-15 DIAGNOSIS — M9902 Segmental and somatic dysfunction of thoracic region: Secondary | ICD-10-CM | POA: Diagnosis not present

## 2021-09-15 DIAGNOSIS — M546 Pain in thoracic spine: Secondary | ICD-10-CM | POA: Diagnosis not present

## 2021-09-15 DIAGNOSIS — M25512 Pain in left shoulder: Secondary | ICD-10-CM | POA: Diagnosis not present

## 2021-09-15 DIAGNOSIS — M9901 Segmental and somatic dysfunction of cervical region: Secondary | ICD-10-CM | POA: Diagnosis not present

## 2021-09-15 DIAGNOSIS — M542 Cervicalgia: Secondary | ICD-10-CM | POA: Diagnosis not present

## 2021-09-15 DIAGNOSIS — M9907 Segmental and somatic dysfunction of upper extremity: Secondary | ICD-10-CM | POA: Diagnosis not present

## 2021-09-16 DIAGNOSIS — F411 Generalized anxiety disorder: Secondary | ICD-10-CM | POA: Diagnosis not present

## 2021-09-16 DIAGNOSIS — R69 Illness, unspecified: Secondary | ICD-10-CM | POA: Diagnosis not present

## 2021-09-19 LAB — TESTOS,TOTAL,FREE AND SHBG (FEMALE)
Free Testosterone: 1.1 pg/mL (ref 0.1–6.4)
Sex Hormone Binding: 87 nmol/L (ref 17–124)
Testosterone, Total, LC-MS-MS: 17 ng/dL (ref 2–45)

## 2021-09-20 DIAGNOSIS — M9901 Segmental and somatic dysfunction of cervical region: Secondary | ICD-10-CM | POA: Diagnosis not present

## 2021-09-20 DIAGNOSIS — M542 Cervicalgia: Secondary | ICD-10-CM | POA: Diagnosis not present

## 2021-09-20 DIAGNOSIS — M9907 Segmental and somatic dysfunction of upper extremity: Secondary | ICD-10-CM | POA: Diagnosis not present

## 2021-09-20 DIAGNOSIS — M9902 Segmental and somatic dysfunction of thoracic region: Secondary | ICD-10-CM | POA: Diagnosis not present

## 2021-09-20 DIAGNOSIS — M25512 Pain in left shoulder: Secondary | ICD-10-CM | POA: Diagnosis not present

## 2021-09-20 DIAGNOSIS — M546 Pain in thoracic spine: Secondary | ICD-10-CM | POA: Diagnosis not present

## 2021-09-22 DIAGNOSIS — F411 Generalized anxiety disorder: Secondary | ICD-10-CM | POA: Diagnosis not present

## 2021-09-22 DIAGNOSIS — G43709 Chronic migraine without aura, not intractable, without status migrainosus: Secondary | ICD-10-CM | POA: Diagnosis not present

## 2021-09-22 DIAGNOSIS — R69 Illness, unspecified: Secondary | ICD-10-CM | POA: Diagnosis not present

## 2021-09-22 DIAGNOSIS — M542 Cervicalgia: Secondary | ICD-10-CM | POA: Diagnosis not present

## 2021-09-24 ENCOUNTER — Other Ambulatory Visit (HOSPITAL_COMMUNITY)
Admission: RE | Admit: 2021-09-24 | Discharge: 2021-09-24 | Disposition: A | Discharge status: Home / Self Care | Payer: 59 | Source: Ambulatory Visit

## 2021-09-24 ENCOUNTER — Other Ambulatory Visit: Payer: Self-pay

## 2021-09-24 ENCOUNTER — Ambulatory Visit (INDEPENDENT_AMBULATORY_CARE_PROVIDER_SITE_OTHER): Payer: 59

## 2021-09-24 ENCOUNTER — Emergency Department
Admission: EM | Admit: 2021-09-24 | Discharge: 2021-09-24 | Disposition: A | Discharge status: Home / Self Care | Payer: 59 | Source: Home / Self Care | Attending: Family Medicine | Admitting: Family Medicine

## 2021-09-24 VITALS — BP 112/73 | HR 87 | Ht 66.0 in | Wt 134.0 lb

## 2021-09-24 DIAGNOSIS — Z01419 Encounter for gynecological examination (general) (routine) without abnormal findings: Secondary | ICD-10-CM | POA: Insufficient documentation

## 2021-09-24 DIAGNOSIS — G43019 Migraine without aura, intractable, without status migrainosus: Secondary | ICD-10-CM

## 2021-09-24 MED ORDER — DEXAMETHASONE SODIUM PHOSPHATE 10 MG/ML IJ SOLN
10.0000 mg | Freq: Once | INTRAMUSCULAR | Status: AC
  Administered 2021-09-24: 10 mg via INTRAMUSCULAR

## 2021-09-24 MED ORDER — KETOROLAC TROMETHAMINE 60 MG/2ML IM SOLN
60.0000 mg | Freq: Once | INTRAMUSCULAR | Status: AC
  Administered 2021-09-24: 60 mg via INTRAMUSCULAR

## 2021-09-24 NOTE — ED Triage Notes (Signed)
Pt states that she has a migraine that isn't going away. X1 week ?

## 2021-09-24 NOTE — Progress Notes (Signed)
? ?  Subjective:  ?  ? Madison Morris is a 36 y.o. female here at [facility] for a routine exam.  Current complaints: none.  Personal health questionnaire reviewed: yes. ? ?Do you have a primary care provider? yes ? ? ?[place] Office Visit from 08/13/2021 in [place]  ?PHQ-2 Total Score 3  ? ?  ? ? ?Health Maintenance Due  ?Topic Date Due  ? HIV Screening  Never done  ? Hepatitis C Screening  Never done  ? COVID-19 Vaccine (2 - Booster for Janssen series) 12/12/2019  ? PAP SMEAR-Modifier  05/29/2021  ?  ? ?Risk factors for chronic health problems: ?Smoking: No ?Alchohol/how much: No ?Illicit drug use: No ?Pt BMI: Body mass index is 21.63 kg/m?. ?  ?Gynecologic History ?Patient's last menstrual period was 09/13/2021. ?Contraception: none ?Last Pap: 05/29/2018. Results were: abnormal; ASCUS, negative HPV ?Last mammogram: n/a  ? ?Obstetric History ?OB History  ?Gravida Para Term Preterm AB Living  ?'3 3 3     3  '$ ?SAB IAB Ectopic Multiple Live Births  ?           ?  ?# Outcome Date GA Lbr Len/2nd Weight Sex Delivery Anes PTL Lv  ?3 Term      Vag-Spont     ?2 Term      Vag-Spont     ?1 Term      Vag-Spont     ? ? ?The following portions of the patient's history were reviewed and updated as appropriate: allergies, current medications, past family history, past medical history, past social history, past surgical history, and problem list. ? ?Review of Systems ?Pertinent items are noted in HPI.  ?  ?Objective:  ? ?BP 112/73   Pulse 87   Ht '5\' 6"'$  (1.676 m)   Wt 134 lb (60.8 kg)   LMP 09/13/2021   BMI 21.63 kg/m?  ?VS reviewed, nursing note reviewed,  ?Constitutional: well developed, well nourished, no distress ?HEENT: normocephalic ?CV: normal rate ?Pulm/chest wall: normal effort ?Breast Exam: Performed: right breast normal without mass, skin or nipple changes or axillary nodes, left breast normal without mass, skin or nipple changes or axillary nodes ?Abdomen: soft ?Neuro: alert and  oriented x 3 ?Skin: warm, dry ?Psych: affect normal ?Pelvic exam: Performed: Cervix pink, visually closed, without lesion, scant white creamy discharge, vaginal walls and external genitalia normal ?Bimanual exam: Cervix 0/long/high, firm, anterior, neg CMT, uterus nontender, nonenlarged, adnexa without tenderness, enlargement, or mass  ? ? ?   ?Assessment/Plan:  ?1. Well woman exam ?- Routine well woman exam ?- Patient considering IUD. Declines STI screening  ? ? ?- Cytology - PAP( [place]) ? ? ? ?Follow up in: 1  year  or as needed.  ? ?Renee Harder, CNM ?10:28 AM  ? ?

## 2021-09-24 NOTE — Discharge Instructions (Signed)
If symptoms become significantly worse during the night or over the weekend, proceed to the local emergency room.  ?

## 2021-09-24 NOTE — ED Provider Notes (Signed)
?[place] ? ? ? ?CSN: [id number] ?Arrival date & time: 09/24/21  1049 ? ? ?  ? ?History   ?Chief Complaint ?Chief Complaint  ?Patient presents with  ? Migraine  ?  Migraine. X1 week  ? ? ?HPI ?Madison Morris is a 36 y.o. female.  ? ?Patient has a long history of migraine headaches, having had poor response to several Migraine medication.  She has had several rounds of Botox injections since 2022.  She complains of persistent generalized headache for one week after having had Botox treatment.  She is having persistent posterior neck muscle spasm contributing to her headache despite PT for cervicalgia.  She denies fevers, chills, and sweats, other neurologic symptoms, and feels well otherwise. ? ?The history is provided by the patient.  ? ?Past Medical History:  ?Diagnosis Date  ? Allergy   ? Mold, dust mites, [place] grass, gluten  ? Anemia July 2014  ? I believe I don't have the problem anymore  ? Anxiety   ? Arthritis March 2018  ? At least in my knees, noted after accident  ? Asthma   ? COVID 07/2020  ? Depression   ? GERD (gastroesophageal reflux disease)   ? H. pylori infection   ? IBS (irritable bowel syndrome)   ? Migraine   ? ? ?Patient Active Problem List  ? Diagnosis Date Noted  ? Depression 04/20/2020  ? Unexplained night sweats 05/29/2018  ? Migraine, unspecified, not intractable, without status migrainosus 12/26/2011  ? Irritable bowel syndrome without diarrhea 05/19/2011  ? Anxiety 05/18/2011  ? ? ?Past Surgical History:  ?Procedure Laterality Date  ? DILATION AND CURETTAGE OF UTERUS    ? HERNIA REPAIR  Augest 2018  ? Repaired, still have issues with it today  ? umbilcal hernia    ? WISDOM TOOTH EXTRACTION    ? ? ?OB History   ? ? Gravida  ?3  ? Para  ?3  ? Term  ?3  ? Preterm  ?   ? AB  ?   ? Living  ?3  ?  ? ? SAB  ?   ? IAB  ?   ? Ectopic  ?   ? Multiple  ?   ? Live Births  ?   ?   ?  ?  ? ? ? ?Home Medications   ? ?Prior to Admission medications   ?Medication Sig Start Date End Date  Taking? Authorizing Provider  ?albuterol (VENTOLIN HFA) 108 (90 Base) MCG/ACT inhaler Inhale into the lungs. 06/21/13  Yes [provider]  ?Botulinum Toxin Type A (BOTOX) 200 units SOLR Inject 200u IM every 84 days forehead/neck muscles 11/10/20  Yes [provider]  ?clonazePAM (KLONOPIN) 1 MG disintegrating tablet Take 1 mg by mouth 3 (three) times daily as needed. 07/13/21  Yes [provider]  ?fluticasone (FLONASE) 50 MCG/ACT nasal spray Place into both nostrils daily.   Yes [provider]  ?gabapentin (NEURONTIN) 250 MG/5ML solution Take by mouth. 07/19/21  Yes [provider]  ?Hyoscyamine Sulfate (HYOSCYAMINE PO) Take by mouth.   Yes [provider]  ?lansoprazole (PREVACID) 30 MG capsule  10/23/19  Yes [provider]  ?Loratadine 10 MG CAPS Take by mouth.   Yes [provider]  ?Multiple Vitamins-Minerals (MULTIVITAMIN GUMMIES WOMENS PO) Take by mouth.   Yes [provider]  ?ondansetron (ZOFRAN) 4 MG tablet Take 4 mg by mouth every 8 (eight) hours as needed for nausea or vomiting.  Yes [provider]  ? ? ?Family History ?Family History  ?Problem Relation Age of Onset  ? Pulmonary Hypertension Mother   ? Early death Mother   ? Hypertension Mother   ? Asthma Father   ? Diabetes Maternal Grandmother   ? Hearing loss Maternal Grandmother   ? Cancer Paternal Grandfather   ? Anxiety disorder Daughter   ? Depression Brother   ? ? ?Social History ?Social History  ? ?Tobacco Use  ? Smoking status: Never  ? Smokeless tobacco: Never  ?Vaping Use  ? Vaping Use: Never used  ?Substance Use Topics  ? Alcohol use: No  ? Drug use: No  ? ? ? ?Allergies   ?Tretinoin and Sulfamethoxazole-trimethoprim ? ? ?Review of Systems ?Review of Systems  ?Constitutional:  Positive for activity change. Negative for appetite change, chills, diaphoresis, fatigue and fever.  ?HENT: Negative.    ?Eyes: Negative.   ?Respiratory: Negative.     ?Cardiovascular: Negative.   ?Gastrointestinal: Negative.   ?Genitourinary: Negative.   ?Musculoskeletal:  Positive for neck pain and neck stiffness.  ?Skin: Negative.   ?Neurological:  Positive for headaches. Negative for dizziness, speech difficulty, weakness and numbness.  ?Hematological:  Negative for adenopathy.  ? ? ?Physical Exam ?Triage Vital Signs ?ED Triage Vitals  ?Enc Vitals Group  ?   BP 09/24/21 1113 119/82  ?   Pulse Rate 09/24/21 1113 90  ?   Resp 09/24/21 1113 20  ?   Temp 09/24/21 1113 98.3 ?F (36.8 ?C)  ?   Temp Source 09/24/21 1113 Oral  ?   SpO2 09/24/21 1113 99 %  ?   Weight 09/24/21 1112 137 lb (62.1 kg)  ?   Height 09/24/21 1112 5' 6.5" (1.689 m)  ?   Head Circumference --   ?   Peak Flow --   ?   Pain Score 09/24/21 1112 6  ?   Pain Loc --   ?   Pain Edu? --   ?   Excl. in [place]? --   ? ?No data found. ? ?Updated Vital Signs ?BP 119/82 (BP Location: Left Arm)   Pulse 90   Temp 98.3 ?F (36.8 ?C) (Oral)   Resp 20   Ht 5' 6.5" (1.689 m)   Wt 62.1 kg   LMP 09/13/2021   SpO2 99%   BMI 21.78 kg/m?  ? ?Visual Acuity ?Right Eye Distance:   ?Left Eye Distance:   ?Bilateral Distance:   ? ?Right Eye Near:   ?Left Eye Near:    ?Bilateral Near:    ? ?Physical Exam ?Nursing notes and Vital Signs reviewed. ?Appearance:  Patient appears stated age, and in no acute distress ?Eyes:  Pupils are equal, round, and reactive to light and accomodation.  Extraocular movement is intact.  Conjunctivae are not inflamed.  Fundi benign.  No photophobia. ?Ears:  Canals normal.  Tympanic membranes normal.  ?Nose:   Normal turbinates.  No sinus tenderness.  ?Pharynx:  Normal ?Neck:  Supple.  No adenopathy or thyromegaly.  Bilateral mild tenderness over paraspinous muscles. ?Lungs:  Clear to auscultation.  Breath sounds are equal.  Moving air well. ?Heart:  Regular rate and rhythm without murmurs, rubs, or gallops.  ?Abdomen:  Nontender without masses or hepatosplenomegaly.  Bowel sounds are present.  No CVA or flank  tenderness.  ?Extremities:  No edema.  ?Skin:  No rash present.  ?Neurologic:  Cranial nerves 2 through 12 are normal.  Patellar, achilles, and elbow reflexes are normal.  Cerebellar function is intact (finger-to-nose and rapid alternating hand movement).  Gait and station are normal.   ? ?UC Treatments / Results  ?Labs ?(all labs ordered are listed, but only abnormal results are displayed) ?Labs Reviewed - No data to display ? ?EKG ? ? ?Radiology ?No results found. ? ?Procedures ?Procedures (including critical care time) ? ?Medications Ordered in UC ?Medications  ?ketorolac (TORADOL) injection 60 mg (has no administration in time range)  ?dexamethasone (DECADRON) injection 10 mg (has no administration in time range)  ? ? ?Initial Impression / Assessment and Plan / UC Course  ?I have reviewed the triage vital signs and the nursing notes. ? ?Pertinent labs & imaging results that were available during my care of the patient were reviewed by me and considered in my medical decision making (see chart for details). ? ?  ?Benign physical exam. ?Administered Toradol '60mg'$  IM and Decadron '10mg'$  IM. ?Followup with neurologist. ? ?Final diagnoses:  ?Intractable migraine without aura and without status migrainosus  ? ? ? ?Discharge Instructions   ? ?  ?If symptoms become significantly worse during the night or over the weekend, proceed to the local emergency room.  ? ? ? ?ED Prescriptions   ?None ?  ? ? ?  ?Kandra Nicolas, MD ?09/26/21 1545 ? ?

## 2021-09-28 DIAGNOSIS — M542 Cervicalgia: Secondary | ICD-10-CM | POA: Diagnosis not present

## 2021-09-28 DIAGNOSIS — M9901 Segmental and somatic dysfunction of cervical region: Secondary | ICD-10-CM | POA: Diagnosis not present

## 2021-09-28 DIAGNOSIS — M9902 Segmental and somatic dysfunction of thoracic region: Secondary | ICD-10-CM | POA: Diagnosis not present

## 2021-09-28 DIAGNOSIS — M546 Pain in thoracic spine: Secondary | ICD-10-CM | POA: Diagnosis not present

## 2021-09-28 DIAGNOSIS — M25512 Pain in left shoulder: Secondary | ICD-10-CM | POA: Diagnosis not present

## 2021-09-28 DIAGNOSIS — M9907 Segmental and somatic dysfunction of upper extremity: Secondary | ICD-10-CM | POA: Diagnosis not present

## 2021-09-28 LAB — CYTOLOGY - PAP
Comment: NEGATIVE
Diagnosis: NEGATIVE
High risk HPV: NEGATIVE

## 2021-09-30 DIAGNOSIS — G43709 Chronic migraine without aura, not intractable, without status migrainosus: Secondary | ICD-10-CM | POA: Diagnosis not present

## 2021-09-30 DIAGNOSIS — M542 Cervicalgia: Secondary | ICD-10-CM | POA: Diagnosis not present

## 2021-10-01 DIAGNOSIS — M542 Cervicalgia: Secondary | ICD-10-CM | POA: Diagnosis not present

## 2021-10-01 DIAGNOSIS — M9907 Segmental and somatic dysfunction of upper extremity: Secondary | ICD-10-CM | POA: Diagnosis not present

## 2021-10-01 DIAGNOSIS — M9901 Segmental and somatic dysfunction of cervical region: Secondary | ICD-10-CM | POA: Diagnosis not present

## 2021-10-01 DIAGNOSIS — M25512 Pain in left shoulder: Secondary | ICD-10-CM | POA: Diagnosis not present

## 2021-10-01 DIAGNOSIS — M9902 Segmental and somatic dysfunction of thoracic region: Secondary | ICD-10-CM | POA: Diagnosis not present

## 2021-10-01 DIAGNOSIS — M546 Pain in thoracic spine: Secondary | ICD-10-CM | POA: Diagnosis not present

## 2021-10-06 DIAGNOSIS — R69 Illness, unspecified: Secondary | ICD-10-CM | POA: Diagnosis not present

## 2021-10-06 DIAGNOSIS — F411 Generalized anxiety disorder: Secondary | ICD-10-CM | POA: Diagnosis not present

## 2021-10-07 DIAGNOSIS — G43709 Chronic migraine without aura, not intractable, without status migrainosus: Secondary | ICD-10-CM | POA: Diagnosis not present

## 2021-10-07 DIAGNOSIS — M542 Cervicalgia: Secondary | ICD-10-CM | POA: Diagnosis not present

## 2021-10-12 ENCOUNTER — Other Ambulatory Visit: Payer: Self-pay | Admitting: Medical-Surgical

## 2021-10-12 ENCOUNTER — Encounter: Payer: Self-pay | Admitting: Medical-Surgical

## 2021-10-12 DIAGNOSIS — J3081 Allergic rhinitis due to animal (cat) (dog) hair and dander: Secondary | ICD-10-CM | POA: Diagnosis not present

## 2021-10-12 DIAGNOSIS — R0602 Shortness of breath: Secondary | ICD-10-CM | POA: Diagnosis not present

## 2021-10-12 DIAGNOSIS — J329 Chronic sinusitis, unspecified: Secondary | ICD-10-CM | POA: Diagnosis not present

## 2021-10-12 DIAGNOSIS — J019 Acute sinusitis, unspecified: Secondary | ICD-10-CM | POA: Diagnosis not present

## 2021-10-12 DIAGNOSIS — G43909 Migraine, unspecified, not intractable, without status migrainosus: Secondary | ICD-10-CM | POA: Diagnosis not present

## 2021-10-12 DIAGNOSIS — J3089 Other allergic rhinitis: Secondary | ICD-10-CM | POA: Diagnosis not present

## 2021-10-12 DIAGNOSIS — J301 Allergic rhinitis due to pollen: Secondary | ICD-10-CM | POA: Diagnosis not present

## 2021-10-12 MED ORDER — CLONAZEPAM 1 MG PO TBDP: ORAL_TABLET | ORAL | 2 refills | Status: AC

## 2021-10-12 MED ORDER — LANSOPRAZOLE 30 MG PO CPDR: 30.0000 mg | DELAYED_RELEASE_CAPSULE | Freq: Every day | ORAL | 1 refills | Status: AC

## 2021-10-13 DIAGNOSIS — F411 Generalized anxiety disorder: Secondary | ICD-10-CM | POA: Diagnosis not present

## 2021-10-13 DIAGNOSIS — R69 Illness, unspecified: Secondary | ICD-10-CM | POA: Diagnosis not present

## 2021-10-14 DIAGNOSIS — G43709 Chronic migraine without aura, not intractable, without status migrainosus: Secondary | ICD-10-CM | POA: Diagnosis not present

## 2021-10-14 DIAGNOSIS — M542 Cervicalgia: Secondary | ICD-10-CM | POA: Diagnosis not present

## 2021-10-20 DIAGNOSIS — R69 Illness, unspecified: Secondary | ICD-10-CM | POA: Diagnosis not present

## 2021-10-20 DIAGNOSIS — F411 Generalized anxiety disorder: Secondary | ICD-10-CM | POA: Diagnosis not present

## 2021-10-21 DIAGNOSIS — M542 Cervicalgia: Secondary | ICD-10-CM | POA: Diagnosis not present

## 2021-10-21 DIAGNOSIS — G43709 Chronic migraine without aura, not intractable, without status migrainosus: Secondary | ICD-10-CM | POA: Diagnosis not present

## 2021-12-17 DIAGNOSIS — M9907 Segmental and somatic dysfunction of upper extremity: Secondary | ICD-10-CM | POA: Diagnosis not present

## 2021-12-17 DIAGNOSIS — M9902 Segmental and somatic dysfunction of thoracic region: Secondary | ICD-10-CM | POA: Diagnosis not present

## 2021-12-17 DIAGNOSIS — M9901 Segmental and somatic dysfunction of cervical region: Secondary | ICD-10-CM | POA: Diagnosis not present

## 2021-12-17 DIAGNOSIS — M25512 Pain in left shoulder: Secondary | ICD-10-CM | POA: Diagnosis not present

## 2021-12-17 DIAGNOSIS — M546 Pain in thoracic spine: Secondary | ICD-10-CM | POA: Diagnosis not present

## 2021-12-17 DIAGNOSIS — M542 Cervicalgia: Secondary | ICD-10-CM | POA: Diagnosis not present

## 2021-12-20 DIAGNOSIS — M542 Cervicalgia: Secondary | ICD-10-CM | POA: Diagnosis not present

## 2021-12-20 DIAGNOSIS — M9901 Segmental and somatic dysfunction of cervical region: Secondary | ICD-10-CM | POA: Diagnosis not present

## 2021-12-20 DIAGNOSIS — M25512 Pain in left shoulder: Secondary | ICD-10-CM | POA: Diagnosis not present

## 2021-12-20 DIAGNOSIS — M9902 Segmental and somatic dysfunction of thoracic region: Secondary | ICD-10-CM | POA: Diagnosis not present

## 2021-12-20 DIAGNOSIS — M546 Pain in thoracic spine: Secondary | ICD-10-CM | POA: Diagnosis not present

## 2021-12-20 DIAGNOSIS — M9907 Segmental and somatic dysfunction of upper extremity: Secondary | ICD-10-CM | POA: Diagnosis not present

## 2021-12-24 DIAGNOSIS — M25512 Pain in left shoulder: Secondary | ICD-10-CM | POA: Diagnosis not present

## 2021-12-24 DIAGNOSIS — M9901 Segmental and somatic dysfunction of cervical region: Secondary | ICD-10-CM | POA: Diagnosis not present

## 2021-12-24 DIAGNOSIS — M546 Pain in thoracic spine: Secondary | ICD-10-CM | POA: Diagnosis not present

## 2021-12-24 DIAGNOSIS — M9902 Segmental and somatic dysfunction of thoracic region: Secondary | ICD-10-CM | POA: Diagnosis not present

## 2021-12-24 DIAGNOSIS — M9907 Segmental and somatic dysfunction of upper extremity: Secondary | ICD-10-CM | POA: Diagnosis not present

## 2021-12-24 DIAGNOSIS — M542 Cervicalgia: Secondary | ICD-10-CM | POA: Diagnosis not present

## 2021-12-29 DIAGNOSIS — M9901 Segmental and somatic dysfunction of cervical region: Secondary | ICD-10-CM | POA: Diagnosis not present

## 2021-12-29 DIAGNOSIS — M542 Cervicalgia: Secondary | ICD-10-CM | POA: Diagnosis not present

## 2021-12-29 DIAGNOSIS — G43709 Chronic migraine without aura, not intractable, without status migrainosus: Secondary | ICD-10-CM | POA: Diagnosis not present

## 2021-12-29 DIAGNOSIS — M9907 Segmental and somatic dysfunction of upper extremity: Secondary | ICD-10-CM | POA: Diagnosis not present

## 2021-12-29 DIAGNOSIS — M546 Pain in thoracic spine: Secondary | ICD-10-CM | POA: Diagnosis not present

## 2021-12-29 DIAGNOSIS — M25512 Pain in left shoulder: Secondary | ICD-10-CM | POA: Diagnosis not present

## 2021-12-29 DIAGNOSIS — M9902 Segmental and somatic dysfunction of thoracic region: Secondary | ICD-10-CM | POA: Diagnosis not present

## 2022-01-05 DIAGNOSIS — M25572 Pain in left ankle and joints of left foot: Secondary | ICD-10-CM | POA: Diagnosis not present

## 2022-01-06 DIAGNOSIS — M542 Cervicalgia: Secondary | ICD-10-CM | POA: Diagnosis not present

## 2022-01-06 DIAGNOSIS — G43709 Chronic migraine without aura, not intractable, without status migrainosus: Secondary | ICD-10-CM | POA: Diagnosis not present

## 2022-01-12 DIAGNOSIS — M542 Cervicalgia: Secondary | ICD-10-CM | POA: Diagnosis not present

## 2022-01-12 DIAGNOSIS — M25512 Pain in left shoulder: Secondary | ICD-10-CM | POA: Diagnosis not present

## 2022-01-12 DIAGNOSIS — R002 Palpitations: Secondary | ICD-10-CM | POA: Diagnosis not present

## 2022-01-12 DIAGNOSIS — M9907 Segmental and somatic dysfunction of upper extremity: Secondary | ICD-10-CM | POA: Diagnosis not present

## 2022-01-12 DIAGNOSIS — R000 Tachycardia, unspecified: Secondary | ICD-10-CM | POA: Diagnosis not present

## 2022-01-12 DIAGNOSIS — M9902 Segmental and somatic dysfunction of thoracic region: Secondary | ICD-10-CM | POA: Diagnosis not present

## 2022-01-12 DIAGNOSIS — M9901 Segmental and somatic dysfunction of cervical region: Secondary | ICD-10-CM | POA: Diagnosis not present

## 2022-01-12 DIAGNOSIS — M546 Pain in thoracic spine: Secondary | ICD-10-CM | POA: Diagnosis not present

## 2022-01-15 DIAGNOSIS — G43019 Migraine without aura, intractable, without status migrainosus: Secondary | ICD-10-CM | POA: Diagnosis not present

## 2022-01-21 DIAGNOSIS — M546 Pain in thoracic spine: Secondary | ICD-10-CM | POA: Diagnosis not present

## 2022-01-21 DIAGNOSIS — M9902 Segmental and somatic dysfunction of thoracic region: Secondary | ICD-10-CM | POA: Diagnosis not present

## 2022-01-21 DIAGNOSIS — M9907 Segmental and somatic dysfunction of upper extremity: Secondary | ICD-10-CM | POA: Diagnosis not present

## 2022-01-21 DIAGNOSIS — M9901 Segmental and somatic dysfunction of cervical region: Secondary | ICD-10-CM | POA: Diagnosis not present

## 2022-01-21 DIAGNOSIS — M25512 Pain in left shoulder: Secondary | ICD-10-CM | POA: Diagnosis not present

## 2022-01-21 DIAGNOSIS — M542 Cervicalgia: Secondary | ICD-10-CM | POA: Diagnosis not present

## 2022-01-25 DIAGNOSIS — M9901 Segmental and somatic dysfunction of cervical region: Secondary | ICD-10-CM | POA: Diagnosis not present

## 2022-01-25 DIAGNOSIS — M546 Pain in thoracic spine: Secondary | ICD-10-CM | POA: Diagnosis not present

## 2022-01-25 DIAGNOSIS — M25512 Pain in left shoulder: Secondary | ICD-10-CM | POA: Diagnosis not present

## 2022-01-25 DIAGNOSIS — M542 Cervicalgia: Secondary | ICD-10-CM | POA: Diagnosis not present

## 2022-01-25 DIAGNOSIS — M9907 Segmental and somatic dysfunction of upper extremity: Secondary | ICD-10-CM | POA: Diagnosis not present

## 2022-01-25 DIAGNOSIS — M9902 Segmental and somatic dysfunction of thoracic region: Secondary | ICD-10-CM | POA: Diagnosis not present

## 2022-01-27 DIAGNOSIS — R519 Headache, unspecified: Secondary | ICD-10-CM | POA: Diagnosis not present

## 2022-01-27 DIAGNOSIS — G43719 Chronic migraine without aura, intractable, without status migrainosus: Secondary | ICD-10-CM | POA: Diagnosis not present

## 2022-01-27 DIAGNOSIS — I440 Atrioventricular block, first degree: Secondary | ICD-10-CM | POA: Diagnosis not present

## 2022-01-27 DIAGNOSIS — G43709 Chronic migraine without aura, not intractable, without status migrainosus: Secondary | ICD-10-CM | POA: Diagnosis not present

## 2022-01-27 DIAGNOSIS — G4486 Cervicogenic headache: Secondary | ICD-10-CM | POA: Diagnosis not present

## 2022-01-27 DIAGNOSIS — Z881 Allergy status to other antibiotic agents status: Secondary | ICD-10-CM | POA: Diagnosis not present

## 2022-01-27 DIAGNOSIS — Z888 Allergy status to other drugs, medicaments and biological substances status: Secondary | ICD-10-CM | POA: Diagnosis not present

## 2022-01-27 DIAGNOSIS — R42 Dizziness and giddiness: Secondary | ICD-10-CM | POA: Diagnosis not present

## 2022-01-27 DIAGNOSIS — U099 Post covid-19 condition, unspecified: Secondary | ICD-10-CM | POA: Diagnosis not present

## 2022-01-27 DIAGNOSIS — R69 Illness, unspecified: Secondary | ICD-10-CM | POA: Diagnosis not present

## 2022-01-27 DIAGNOSIS — Z882 Allergy status to sulfonamides status: Secondary | ICD-10-CM | POA: Diagnosis not present

## 2022-01-27 DIAGNOSIS — Z8616 Personal history of COVID-19: Secondary | ICD-10-CM | POA: Diagnosis not present

## 2022-01-28 DIAGNOSIS — M9901 Segmental and somatic dysfunction of cervical region: Secondary | ICD-10-CM | POA: Diagnosis not present

## 2022-01-28 DIAGNOSIS — M25512 Pain in left shoulder: Secondary | ICD-10-CM | POA: Diagnosis not present

## 2022-01-28 DIAGNOSIS — M546 Pain in thoracic spine: Secondary | ICD-10-CM | POA: Diagnosis not present

## 2022-01-28 DIAGNOSIS — M9907 Segmental and somatic dysfunction of upper extremity: Secondary | ICD-10-CM | POA: Diagnosis not present

## 2022-01-28 DIAGNOSIS — M542 Cervicalgia: Secondary | ICD-10-CM | POA: Diagnosis not present

## 2022-01-28 DIAGNOSIS — M9902 Segmental and somatic dysfunction of thoracic region: Secondary | ICD-10-CM | POA: Diagnosis not present

## 2022-02-11 ENCOUNTER — Encounter: Payer: 59 | Admitting: Medical-Surgical

## 2022-03-29 ENCOUNTER — Ambulatory Visit: Payer: 59

## 2022-03-29 ENCOUNTER — Other Ambulatory Visit (HOSPITAL_COMMUNITY)
Admission: RE | Admit: 2022-03-29 | Discharge: 2022-03-29 | Disposition: A | Discharge status: Home / Self Care | Payer: 59 | Source: Ambulatory Visit

## 2022-03-29 ENCOUNTER — Ambulatory Visit (INDEPENDENT_AMBULATORY_CARE_PROVIDER_SITE_OTHER): Payer: 59

## 2022-03-29 VITALS — BP 117/79 | HR 86 | Resp 16 | Ht 66.5 in | Wt 138.0 lb

## 2022-03-29 DIAGNOSIS — R102 Pelvic and perineal pain: Secondary | ICD-10-CM

## 2022-03-29 DIAGNOSIS — B3731 Acute candidiasis of vulva and vagina: Secondary | ICD-10-CM | POA: Insufficient documentation

## 2022-03-29 LAB — POCT URINALYSIS DIPSTICK OB
Appearance: NORMAL
Bilirubin, UA: NEGATIVE
Blood, UA: NEGATIVE
Glucose, UA: NEGATIVE
Ketones, UA: NEGATIVE
Leukocytes, UA: NEGATIVE
Nitrite, UA: NEGATIVE
POC,PROTEIN,UA: NEGATIVE
Spec Grav, UA: <=1.005 — AB (ref 1.010–1.025)
Urobilinogen, UA: NEGATIVE E.U./dL — AB
pH, UA: 7 (ref 5.0–8.0)

## 2022-03-29 NOTE — Progress Notes (Signed)
   GYNECOLOGY PROGRESS NOTE  History:  36 y.o. G3P3003 presents to [place] office today for problem gyn visit. She reports pelvic pain after menses for several years. She reports she finally noticed this past spring the pattern of it occurring after menses for at least 24-48 hours. She reports the pain is a constant, cramping sensation. It feels as if "something is going to fall out". She reports this most recent time it started out on her right side and radiated into back before it finally moved into her left side. She has a history of constipation, but pain is different than her usual pain. She has tried heat and Ibuprofen without relief.  No fever, chills, abnormal discharge, or urinary s/s. LMP was 03/17/2022.  The following portions of the patient's history were reviewed and updated as appropriate: allergies, current medications, past family history, past medical history, past social history, past surgical history and problem list. Last pap smear on 3/23 was normal, negative HRHPV.  Health Maintenance Due  Topic Date Due   HIV Screening  Never done   Hepatitis C Screening  Never done   COVID-19 Vaccine (2 - Booster for Janssen series) 12/12/2019   INFLUENZA VACCINE  02/08/2022     Review of Systems:  Pertinent items are noted in HPI.   Objective:  Physical Exam Blood pressure 117/79, pulse 86, resp. rate 16, height 5' 6.5" (1.689 m), weight 138 lb (62.6 kg), last menstrual period 03/17/2022. VS reviewed, nursing note reviewed,  Constitutional: well developed, well nourished, no distress HEENT: normocephalic CV: normal rate Pulm/chest wall: normal effort Breast Exam: deferred Abdomen: soft Neuro: alert and oriented x 3 Skin: warm, dry Psych: affect normal Pelvic exam: Cervix pink, visually closed, without lesion, scant white creamy discharge, vaginal walls and external genitalia normal Bimanual exam: Cervix 0/long/high, firm, anterior, neg CMT, uterus somewhat tender, nonenlarged,  adnexa without tenderness, enlargement, or mass  Assessment & Plan:  1. Pelvic pain in female - UA and culture ordered given that pain radiated into flank area although I have a low suspicion for UTI - No recent STI screening so will order today - After thorough discussion of options with patient, she declines contraceptive options as she has a significant history of migraines with aura in which she receives Botox for treatment. I discussed IUD options but she has no desire to use IUD or any other form of contraception. I also discussed scheduled NSAID use, however she does not like to use much as it causes rebound headaches.  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 She reports she was offered surgical option at that time , however was hesitant. After much consideration, she really would like to discuss moving forward with a surgical option.  - Will have patient meet with MD at next visit  - Urine Culture - POC Urinalysis Dipstick OB - US PELVIC COMPLETE WITH TRANSVAGINAL; Future - Cervicovaginal ancillary only( [place])   Return in about 4 weeks (around 04/26/2022) for 3-4 weeks with MD.   Renee Harder, CNM 8:52 AM

## 2022-03-29 NOTE — Progress Notes (Signed)
Last pap 3/23

## 2022-03-30 LAB — CERVICOVAGINAL ANCILLARY ONLY
Bacterial Vaginitis (gardnerella): NEGATIVE
Candida Glabrata: NEGATIVE
Candida Vaginitis: POSITIVE — AB
Chlamydia: NEGATIVE
Comment: NEGATIVE
Comment: NEGATIVE
Comment: NEGATIVE
Comment: NEGATIVE
Comment: NEGATIVE
Comment: NORMAL
Neisseria Gonorrhea: NEGATIVE
Trichomonas: NEGATIVE

## 2022-03-31 LAB — URINE CULTURE
MICRO NUMBER:: 13937859
SPECIMEN QUALITY:: ADEQUATE

## 2022-04-12 DIAGNOSIS — J3081 Allergic rhinitis due to animal (cat) (dog) hair and dander: Secondary | ICD-10-CM | POA: Diagnosis not present

## 2022-04-12 DIAGNOSIS — J301 Allergic rhinitis due to pollen: Secondary | ICD-10-CM | POA: Diagnosis not present

## 2022-04-12 DIAGNOSIS — R0602 Shortness of breath: Secondary | ICD-10-CM | POA: Diagnosis not present

## 2022-04-12 DIAGNOSIS — L209 Atopic dermatitis, unspecified: Secondary | ICD-10-CM | POA: Diagnosis not present

## 2022-04-12 DIAGNOSIS — J3089 Other allergic rhinitis: Secondary | ICD-10-CM | POA: Diagnosis not present

## 2022-04-12 DIAGNOSIS — L509 Urticaria, unspecified: Secondary | ICD-10-CM | POA: Diagnosis not present

## 2022-04-12 DIAGNOSIS — J019 Acute sinusitis, unspecified: Secondary | ICD-10-CM | POA: Diagnosis not present

## 2022-04-18 NOTE — Progress Notes (Signed)
GYNECOLOGY OFFICE VISIT NOTE  History:   Madison Morris is a 36 y.o. [phone] here today for pain that starts after her menses 24-48 hours after. She was previously diagnosed with likely adenomyosis and declined intervention at that time (about a year ago). Cycles are irregular. Her pain is not controlled with NSAIDs. She does not want anything hormonal.   She has had the pain for several years prior to her first [place] assessment in 2019.   She had an [place] within the last year at [facility] which report was normal. Per a note in her chart from 2022 at [facility] (June 2022) she had an [place] in 2019 which diagnosed her with adenomyosis. This [place] is not available to me.   She has migraine with aura and cannot do combined OCPs.  She tried the Mirena previously and had it in for 6 months but had to have it removed due to side effects.  She notes pain with intercourse.   Her friend, Ramiro Harvest, was with her for support today.     Past Medical History:  Diagnosis Date   Allergy    Mold, dust mites, [place] grass, gluten   Anemia July 2014   I believe I don't have the problem anymore   Anxiety    Arthritis March 2018   At least in my knees, noted after accident   Asthma    COVID 07/2020   Depression    GERD (gastroesophageal reflux disease)    H. pylori infection    IBS (irritable bowel syndrome)    Migraine     Past Surgical History:  Procedure Laterality Date   DILATION AND CURETTAGE OF UTERUS     HERNIA REPAIR  Augest 2018   Repaired, still have issues with it today   umbilcal hernia     WISDOM TOOTH EXTRACTION      The following portions of the patient's history were reviewed and updated as appropriate: allergies, current medications, past family history, past medical history, past social history, past surgical history and problem list.   Health Maintenance:   Normal pap and negative HRHPV:   Diagnosis  Date Value Ref Range Status  09/24/2021   Final   - Negative for intraepithelial lesion  or malignancy (NILM)    Review of Systems:  Pertinent items noted in HPI and remainder of comprehensive ROS otherwise negative.  Physical Exam:  LMP 03/17/2022  CONSTITUTIONAL: Well-developed, well-nourished female in no acute distress.  HEENT:  Normocephalic, atraumatic. External right and left ear normal. No scleral icterus.  NECK: Normal range of motion, supple, no masses noted on observation SKIN: No rash noted. Not diaphoretic. No erythema. No pallor. MUSCULOSKELETAL: Normal range of motion. No edema noted. NEUROLOGIC: Alert and oriented to person, place, and time. Normal muscle tone coordination. No cranial nerve deficit noted. PSYCHIATRIC: Normal mood and affect. Normal behavior. Normal judgment and thought content.  PELVIC: Deferred  Labs and Imaging No results found for this or any previous visit (from the past 168 hour(s)). US PELVIC COMPLETE WITH TRANSVAGINAL  Result Date: 03/30/2022 CLINICAL DATA:  Initial evaluation for intermittent pelvic pain for 1 year. EXAM: TRANSABDOMINAL AND TRANSVAGINAL ULTRASOUND OF PELVIS TECHNIQUE: Both transabdominal and transvaginal ultrasound examinations of the pelvis were performed. Transabdominal technique was performed for global imaging of the pelvis including uterus, ovaries, adnexal regions, and pelvic cul-de-sac. It was necessary to proceed with endovaginal exam following the transabdominal exam to visualize the endometrium. COMPARISON:  Ultrasound from 05/31/2018. FINDINGS: Uterus Measurements: 8.5 x  3.2 x 5.6 cm = volume: 79.6 mL. Uterus is anteverted. No discrete fibroid or other myometrial abnormality. Endometrium Thickness: 9.8 mm.  No focal abnormality visualized. Right ovary Measurements: 3.6 x 1.7 x 1.8 cm = volume: 6.0 mL. Normal appearance/no adnexal mass. Left ovary Measurements: 3.4 x 1.8 x 2.5 cm = volume: 8.1 mL. Normal appearance/no adnexal mass. Other findings Small volume free fluid seen within the pelvis. Asymmetric prominence  of the pelvic vasculature seen within the left adnexa. IMPRESSION: 1. Asymmetric prominence of the pelvic vasculature within the left adnexa, nonspecific, but can be seen in the setting of pelvic congestion syndrome. 2. Small volume free fluid within the pelvis, nonspecific, but most commonly physiologic. 3. Otherwise unremarkable and normal pelvic ultrasound. Electronically Signed   By: Jeannine Boga M.D.   On: 03/30/2022 18:52    Assessment and Plan:   1. Dysmenorrhea - Discussed differential: i.e. secondary dysmenorrhea vs endometriosis vs other etiology such as adenomyosis which she had been diagnosed in the past by [place] with this - Discussed treatment options: Motrin prn severe cramps along with tylenol, OCPs, Nuvaring, Depo, Nexplanon, Lng-IUD and other hormonal options i.e. Edward Qualia. She declines hormonal therapy. She has tried OCPs, NSAIDs and LNG-IUD without success and could not tolerate their side effects.  - We also discussed possible prevention of progression of endometriosis if this is the cause with use of OCPs, although not definitive.  - Diagnosis: Pelvic pain, dysmenorrhea, dyspareunia - Planned surgery: TVH, bilateral salpingectomy. Discussed may need conversion to laparoscopic surgery or open surgery depending on findings at the time of surgery. If L/S, she has history of umbilical mesh and so my initial entry would be LUQ.  - Risks of surgery include but are not limited to: bleeding, infection, injury to surrounding organs/tissues (i.e. bowel/bladder/ureters), need for additional procedures, wound complications, hospital re-admission, and conversion to open surgery, VTE, earlier menopause, vaginal prolapse. We discussed if she has a complication above how they are typically managed and expected recovery. We discussed the potential need for separate surgeries after the fact.  - We discussed postop restrictions, precautions and expectations - Preop testing needed: None - All  questions answered   Routine preventative health maintenance measures emphasized. Please refer to After Visit Summary for other counseling recommendations.   No follow-ups on file.  Radene Gunning, MD, [place] [facility], [place]

## 2022-04-21 ENCOUNTER — Ambulatory Visit: Payer: 59 | Admitting: Obstetrics and Gynecology

## 2022-04-21 ENCOUNTER — Encounter: Payer: Self-pay | Admitting: Obstetrics and Gynecology

## 2022-04-21 VITALS — BP 117/88 | HR 83 | Ht 66.0 in | Wt 140.0 lb

## 2022-04-21 DIAGNOSIS — N946 Dysmenorrhea, unspecified: Secondary | ICD-10-CM | POA: Diagnosis not present

## 2022-04-25 ENCOUNTER — Ambulatory Visit: Payer: 59 | Admitting: Obstetrics and Gynecology

## 2022-05-11 DIAGNOSIS — G43709 Chronic migraine without aura, not intractable, without status migrainosus: Secondary | ICD-10-CM | POA: Diagnosis not present

## 2022-05-13 DIAGNOSIS — G43709 Chronic migraine without aura, not intractable, without status migrainosus: Secondary | ICD-10-CM | POA: Diagnosis not present

## 2022-05-23 NOTE — Pre-Procedure Instructions (Signed)
Surgical Instructions    Your procedure is scheduled on Tuesday, November 28.  Report to [facility] Main Entrance "A" at 10:15 A.M., then check in with the Admitting office.  Call this number if you have problems the morning of surgery:  [phone]   If you have any questions prior to your surgery date call [phone]: Open Monday-Friday 8am-4pm If you experience any cold or flu symptoms such as cough, fever, chills, shortness of breath, etc. between now and your scheduled surgery, please notify us at the above number     Remember:  Do not eat after midnight the night before your surgery  You may drink clear liquids until 9:15AM the morning of your surgery.   Clear liquids allowed are: Water, Non-Citrus Juices (without pulp), Carbonated Beverages, Clear Tea, Black Coffee ONLY (NO MILK, CREAM OR POWDERED CREAMER of any kind), and Gatorade    Take these medicines the morning of surgery with A SIP OF WATER:  baclofen (LIORESAL)  fluticasone (FLONASE) 50 MCG/ACT nasal spray  lansoprazole (PREVACID)  Loratadine  ondansetron (ZOFRAN)  if needed clonazePAM (KLONOPIN)  if needed albuterol (VENTOLIN HFA) 108 (90 Base) MCG/ACT inhaler  if needed- Please bring all inhalers with you the day of surgery.     7 days prior to your surgery, STOP taking any Aspirin (unless otherwise instructed by your surgeon) Aleve, Naproxen, Ibuprofen, Motrin, Advil, Goody's, BC's, all herbal medications, fish oil, and all vitamins.             [facility] is not responsible for any belongings or valuables.    Do NOT Smoke (Tobacco/Vaping)  24 hours prior to your procedure  If you use a CPAP at night, you may bring your mask for your overnight stay.   Contacts, glasses, hearing aids, dentures or partials may not be worn into surgery, please bring cases for these belongings   For patients admitted to the hospital, discharge time will be determined by your treatment team.   Patients discharged the day  of surgery will not be allowed to drive home, and someone needs to stay with them for 24 hours.   SURGICAL WAITING ROOM VISITATION Patients having surgery or a procedure may have no more than 2 support people in the waiting area - these visitors may rotate.   Children under the age of 29 must have an adult with them who is not the patient. If the patient needs to stay at the hospital during part of their recovery, the visitor guidelines for inpatient rooms apply. Pre-op nurse will coordinate an appropriate time for 1 support person to accompany patient in pre-op.  This support person may not rotate.   Please refer to [email or web address] for the visitor guidelines for Inpatients (after your surgery is over and you are in a regular room).    Special instructions:    Oral Hygiene is also important to reduce your risk of infection.  Remember - BRUSH YOUR TEETH THE MORNING OF SURGERY WITH YOUR REGULAR TOOTHPASTE   [place]- Preparing For Surgery  Before surgery, you can play an important role. Because skin is not sterile, your skin needs to be as free of germs as possible. You can reduce the number of germs on your skin by washing with CHG (chlorahexidine gluconate) Soap before surgery.  CHG is an antiseptic cleaner which kills germs and bonds with the skin to continue killing germs even after washing.     Please do not use if you have an allergy to CHG  or antibacterial soaps. If your skin becomes reddened/irritated stop using the CHG.  Do not shave (including legs and underarms) for at least 48 hours prior to first CHG shower. It is OK to shave your face.  Please follow these instructions carefully.     Shower the NIGHT BEFORE SURGERY and the MORNING OF SURGERY with CHG Soap.   If you chose to wash your hair, wash your hair first as usual with your normal shampoo. After you shampoo, rinse your hair and body thoroughly to remove the  shampoo.  Then [employer] and genitals (private parts) with your normal soap and rinse thoroughly to remove soap.  After that Use CHG Soap as you would any other liquid soap. You can apply CHG directly to the skin and wash gently with a scrungie or a clean washcloth.   Apply the CHG Soap to your body ONLY FROM THE NECK DOWN.  Do not use on open wounds or open sores. Avoid contact with your eyes, ears, mouth and genitals (private parts). Wash Face and genitals (private parts)  with your normal soap.   Wash thoroughly, paying special attention to the area where your surgery will be performed.  Thoroughly rinse your body with warm water from the neck down.  DO NOT shower/wash with your normal soap after using and rinsing off the CHG Soap.  Pat yourself dry with a CLEAN TOWEL.  Wear CLEAN PAJAMAS to bed the night before surgery  Place CLEAN SHEETS on your bed the night before your surgery  DO NOT SLEEP WITH PETS.   Day of Surgery:  Take a shower with CHG soap. Wear Clean/Comfortable clothing the morning of surgery Do not wear jewelry or makeup. Do not wear lotions, powders, perfumes/cologne or deodorant. Do not shave 48 hours prior to surgery.  Men may shave face and neck. Do not bring valuables to the hospital. Do not wear nail polish, gel polish, artificial nails, or any other type of covering on natural nails (fingers and toes) If you have artificial nails or gel coating that need to be removed by a nail salon, please have this removed prior to surgery. Artificial nails or gel coating may interfere with anesthesia's ability to adequately monitor your vital signs. Remember to brush your teeth WITH YOUR REGULAR TOOTHPASTE.    If you received a COVID test during your pre-op visit, it is requested that you wear a mask when out in public, stay away from anyone that may not be feeling well, and notify your surgeon if you develop symptoms. If you have been in contact with anyone that has  tested positive in the last 10 days, please notify your surgeon.    Please read over the following fact sheets that you were given.

## 2022-05-24 ENCOUNTER — Other Ambulatory Visit: Payer: Self-pay

## 2022-05-24 ENCOUNTER — Encounter (HOSPITAL_COMMUNITY): Payer: Self-pay

## 2022-05-24 ENCOUNTER — Encounter (HOSPITAL_COMMUNITY)
Admission: RE | Admit: 2022-05-24 | Discharge: 2022-05-24 | Disposition: A | Discharge status: Home / Self Care | Payer: 59 | Source: Ambulatory Visit | Attending: Obstetrics and Gynecology | Admitting: Obstetrics and Gynecology

## 2022-05-24 DIAGNOSIS — Z01812 Encounter for preprocedural laboratory examination: Secondary | ICD-10-CM | POA: Diagnosis not present

## 2022-05-24 DIAGNOSIS — N946 Dysmenorrhea, unspecified: Secondary | ICD-10-CM | POA: Insufficient documentation

## 2022-05-24 HISTORY — DX: Nausea with vomiting, unspecified: Z98.890

## 2022-05-24 HISTORY — DX: Angina pectoris, unspecified: I20.9

## 2022-05-24 LAB — CBC
HCT: 45 % (ref 36.0–46.0)
Hemoglobin: 15 g/dL (ref 12.0–15.0)
MCH: 30.4 pg (ref 26.0–34.0)
MCHC: 33.3 g/dL (ref 30.0–36.0)
MCV: 91.1 fL (ref 80.0–100.0)
Platelets: 265 10*3/uL (ref 150–400)
RBC: 4.94 MIL/uL (ref 3.87–5.11)
RDW: 12.5 % (ref 11.5–15.5)
WBC: 6.8 10*3/uL (ref 4.0–10.5)
nRBC: 0 % (ref 0.0–0.2)

## 2022-05-24 LAB — TYPE AND SCREEN
ABO/RH(D): B POS
Antibody Screen: NEGATIVE

## 2022-05-24 NOTE — Pre-Procedure Instructions (Signed)
Surgical Instructions    Your procedure is scheduled on Tuesday November 28.  Report to [facility] Main Entrance "A" at 10:15 A.M., then check in with the Admitting office.  Call this number if you have problems the morning of surgery:  [phone]  If you have any questions prior to your surgery date call [phone]: Open Monday-Friday 8am-4pm  If you experience any cold, Covid, or flu symptoms such as cough, fever, chills, shortness of breath, etc. between now and your scheduled surgery, please notify us at the above number   Patient Instructions   The night before surgery:    No food after midnight. ONLY clear liquids after midnight   The day of surgery:    You may drink clear liquids until 9:15 the morning of your surgery.   Clear liquids allowed are: Water, Non-Citrus Juices (without pulp), Carbonated Beverages, Clear Tea, Black Coffee ONLY (NO MILK, CREAM OR POWDERED CREAMER of any kind), and Gatorade           If you have questions, please contact your surgeon's office.   You may take these medications with A SIP OF WATER IF YOU NEED THEM: acetaminophen (TYLENOL)  albuterol (VENTOLIN HFA)  clonazePAM (KLONOPIN)  fluticasone (FLONASE)  ondansetron (ZOFRAN-ODT)  rizatriptan (MAXALT-MLT)  tiZANidine (ZANAFLEX)    As of today, STOP taking any Aspirin (unless otherwise instructed by your surgeon) Aleve, Naproxen, Ibuprofen, Motrin, Advil, Goody's, BC's, all herbal medications, fish oil, and all vitamins.          Do NOT Smoke (Tobacco/Vaping)  24 hours prior to your procedure  If you use a CPAP at night, you may bring your mask for your overnight stay.   Contacts, glasses, hearing aids, dentures or partials may not be worn into surgery, please bring cases for these belongings   For patients admitted to the hospital, discharge time will be determined by your treatment team.   Patients discharged the day of surgery will not be allowed to drive home, and someone needs  to stay with them for 24 hours.   SURGICAL WAITING ROOM VISITATION Patients having surgery or a procedure may have no more than 2 support people in the waiting area - these visitors may rotate.   Children under the age of 74 must have an adult with them who is not the patient. If the patient needs to stay at the hospital during part of their recovery, the visitor guidelines for inpatient rooms apply. Pre-op nurse will coordinate an appropriate time for 1 support person to accompany patient in pre-op.  This support person may not rotate.   Please refer to the [facility] website for the visitor guidelines for Inpatients (after your surgery is over and you are in a regular room).    Special instructions:    Oral Hygiene is also important to reduce your risk of infection.  Remember -  BRUSH YOUR TEETH THE MORNING OF SURGERY WITH YOUR REGULAR TOOTHPASTE   [place]- Preparing For Surgery  Before surgery, you can play an important role. Because skin is not sterile, your skin needs to be as free of germs as possible. You can reduce the number of germs on your skin by washing with CHG (chlorahexidine gluconate) Soap before surgery.  CHG is an antiseptic cleaner which kills germs and bonds with the skin to continue killing germs even after washing.     Please do not use if you have an allergy to CHG or antibacterial soaps. If your skin becomes reddened/irritated  stop using the CHG.  Do not shave (including legs and underarms) for at least 48 hours prior to first CHG shower. It is OK to shave your face.  Please follow these instructions carefully.    Shower the NIGHT BEFORE SURGERY and the MORNING OF SURGERY with CHG Soap.  If you chose to wash your hair, wash your hair first as usual with your normal shampoo.  After you shampoo, rinse your hair and body thoroughly to remove the shampoo.   Then [employer] and genitals (private parts) with your normal soap and rinse thoroughly to remove  soap.  After that Use CHG Soap as you would any other liquid soap.  You can apply CHG directly to the skin and wash gently with a scrungie or a clean washcloth.   Apply the CHG Soap to your body ONLY FROM THE NECK DOWN.   Do not use on open wounds or open sores.  Avoid contact with your eyes, ears, mouth and genitals (private parts).  Wash thoroughly, paying special attention to the area where your surgery will be performed.  Thoroughly rinse your body with warm water from the neck down.  DO NOT shower/wash with your normal soap after using and rinsing off the CHG Soap.  Pat yourself dry with a CLEAN TOWEL.  Wear CLEAN PAJAMAS to bed the night before surgery  Place CLEAN SHEETS on your bed the night before your surgery  DO NOT SLEEP WITH PETS.   Day of Surgery:  Take a shower with CHG soap. Wear Clean/Comfortable clothing the morning of surgery Brush your teeth WITH YOUR REGULAR TOOTHPASTE. Do not wear jewelry or makeup. Do not wear lotions, powders, perfumes or deodorant. Do not shave 48 hours prior to surgery.   Do not bring valuables to the hospital.  [facility] is not responsible for any belongings or valuables.  Do not wear nail polish, gel polish, artificial nails, or any other type of covering on natural nails (fingers and toes) If you have artificial nails or gel coating that need to be removed by a nail salon, please have this removed prior to surgery. Artificial nails or gel coating may interfere with anesthesia's ability to adequately monitor your vital signs.   If you received a COVID test during your pre-op visit, it is requested that you wear a mask when out in public, stay away from anyone that may not be feeling well, and notify your surgeon if you develop symptoms. If you have been in contact with anyone that has tested positive in the last 10 days, please notify your surgeon.    Please read over the following fact sheets that you were given.

## 2022-05-24 NOTE — Progress Notes (Signed)
PCP - Nino Parsley (maybe Lennox Grumbles) NP- [place] in [facility] Cardiologist - denies  PPM/ICD - denies  Chest x-ray - denies EKG - 2022 [facility] Stress Test - denies ECHO - 2022 [facility] Cardiac Cath - denies  Sleep Study - denies CPAP - n/a  Not diabetic.  As of today, STOP taking any Aspirin (unless otherwise instructed by your surgeon) Aleve, Naproxen, Ibuprofen, Motrin, Advil, Goody's, BC's, all herbal medications, fish oil, and all vitamins.   ERAS Protcol - yes PRE-SURGERY Ensure or G2- no  COVID TEST- n/a  Anesthesia review: no  Patient denies shortness of breath, fever, cough and chest pain at PAT appointment   All instructions explained to the patient, with a verbal understanding of the material. Patient agrees to go over the instructions while at home for a better understanding. The opportunity to ask questions was provided.

## 2022-06-06 NOTE — Progress Notes (Signed)
Patient was called and informed that the surgery time for surgery tomorrow was changed from 12:15 to 12:45 o'clock. Patient was instructed to be at the hospital at 10:45 o'clock and to stop clear liquids at 09:45 o'clock. Patient verbalized understanding.

## 2022-06-07 ENCOUNTER — Other Ambulatory Visit: Payer: Self-pay

## 2022-06-07 ENCOUNTER — Ambulatory Visit (HOSPITAL_COMMUNITY): Payer: 59 | Admitting: Certified Registered Nurse Anesthetist

## 2022-06-07 ENCOUNTER — Encounter (HOSPITAL_COMMUNITY)
Admission: RE | Disposition: A | Discharge status: Home / Self Care | Payer: Self-pay | Source: Home / Self Care | Attending: Obstetrics and Gynecology

## 2022-06-07 ENCOUNTER — Encounter (HOSPITAL_COMMUNITY): Payer: Self-pay | Admitting: Obstetrics and Gynecology

## 2022-06-07 ENCOUNTER — Ambulatory Visit (HOSPITAL_BASED_OUTPATIENT_CLINIC_OR_DEPARTMENT_OTHER): Payer: 59 | Admitting: Certified Registered Nurse Anesthetist

## 2022-06-07 ENCOUNTER — Ambulatory Visit (HOSPITAL_BASED_OUTPATIENT_CLINIC_OR_DEPARTMENT_OTHER)
Admission: RE | Admit: 2022-06-07 | Discharge: 2022-06-07 | Disposition: A | Discharge status: Home / Self Care | Payer: 59 | Source: Home / Self Care | Attending: Obstetrics and Gynecology | Admitting: Obstetrics and Gynecology

## 2022-06-07 DIAGNOSIS — F418 Other specified anxiety disorders: Secondary | ICD-10-CM

## 2022-06-07 DIAGNOSIS — N946 Dysmenorrhea, unspecified: Secondary | ICD-10-CM

## 2022-06-07 DIAGNOSIS — Z79899 Other long term (current) drug therapy: Secondary | ICD-10-CM | POA: Diagnosis not present

## 2022-06-07 DIAGNOSIS — N8003 Adenomyosis of the uterus: Secondary | ICD-10-CM | POA: Diagnosis not present

## 2022-06-07 DIAGNOSIS — R000 Tachycardia, unspecified: Secondary | ICD-10-CM | POA: Diagnosis not present

## 2022-06-07 DIAGNOSIS — J45909 Unspecified asthma, uncomplicated: Secondary | ICD-10-CM | POA: Diagnosis not present

## 2022-06-07 DIAGNOSIS — D259 Leiomyoma of uterus, unspecified: Secondary | ICD-10-CM

## 2022-06-07 DIAGNOSIS — N99840 Postprocedural hematoma of a genitourinary system organ or structure following a genitourinary system procedure: Secondary | ICD-10-CM | POA: Diagnosis not present

## 2022-06-07 DIAGNOSIS — R002 Palpitations: Secondary | ICD-10-CM | POA: Diagnosis present

## 2022-06-07 DIAGNOSIS — R778 Other specified abnormalities of plasma proteins: Secondary | ICD-10-CM | POA: Diagnosis not present

## 2022-06-07 DIAGNOSIS — D62 Acute posthemorrhagic anemia: Secondary | ICD-10-CM | POA: Diagnosis not present

## 2022-06-07 DIAGNOSIS — Z8616 Personal history of COVID-19: Secondary | ICD-10-CM | POA: Diagnosis not present

## 2022-06-07 DIAGNOSIS — N888 Other specified noninflammatory disorders of cervix uteri: Secondary | ICD-10-CM | POA: Insufficient documentation

## 2022-06-07 DIAGNOSIS — N9489 Other specified conditions associated with female genital organs and menstrual cycle: Secondary | ICD-10-CM | POA: Diagnosis not present

## 2022-06-07 DIAGNOSIS — R079 Chest pain, unspecified: Secondary | ICD-10-CM | POA: Diagnosis not present

## 2022-06-07 DIAGNOSIS — N838 Other noninflammatory disorders of ovary, fallopian tube and broad ligament: Secondary | ICD-10-CM | POA: Diagnosis not present

## 2022-06-07 DIAGNOSIS — R109 Unspecified abdominal pain: Secondary | ICD-10-CM | POA: Diagnosis not present

## 2022-06-07 DIAGNOSIS — M1990 Unspecified osteoarthritis, unspecified site: Secondary | ICD-10-CM | POA: Diagnosis not present

## 2022-06-07 DIAGNOSIS — K219 Gastro-esophageal reflux disease without esophagitis: Secondary | ICD-10-CM | POA: Diagnosis not present

## 2022-06-07 DIAGNOSIS — D649 Anemia, unspecified: Secondary | ICD-10-CM | POA: Diagnosis not present

## 2022-06-07 HISTORY — PX: VAGINAL HYSTERECTOMY: SHX2639

## 2022-06-07 LAB — ABO/RH: ABO/RH(D): B POS

## 2022-06-07 LAB — POCT PREGNANCY, URINE: Preg Test, Ur: NEGATIVE

## 2022-06-07 SURGERY — HYSTERECTOMY, VAGINAL
Anesthesia: General | Site: Uterus | Laterality: Bilateral

## 2022-06-07 MED ORDER — OXYCODONE HCL 5 MG PO TABS: 5.0000 mg | ORAL_TABLET | ORAL | 0 refills | Status: DC | PRN

## 2022-06-07 MED ORDER — ACETAMINOPHEN 500 MG PO TABS: 500.0000 mg | ORAL_TABLET | Freq: Three times a day (TID) | ORAL | 0 refills | Status: DC | PRN

## 2022-06-07 MED ORDER — DEXAMETHASONE SODIUM PHOSPHATE 10 MG/ML IJ SOLN
INTRAMUSCULAR | Status: AC
  Filled 2022-06-07: qty 1

## 2022-06-07 MED ORDER — KETOROLAC TROMETHAMINE 15 MG/ML IJ SOLN
INTRAMUSCULAR | Status: AC
  Filled 2022-06-07: qty 1

## 2022-06-07 MED ORDER — FENTANYL CITRATE (PF) 250 MCG/5ML IJ SOLN
INTRAMUSCULAR | Status: AC
  Filled 2022-06-07: qty 5

## 2022-06-07 MED ORDER — FENTANYL CITRATE (PF) 100 MCG/2ML IJ SOLN
25.0000 ug | INTRAMUSCULAR | Status: DC | PRN
  Administered 2022-06-07: 50 ug via INTRAVENOUS

## 2022-06-07 MED ORDER — MIDAZOLAM HCL 2 MG/2ML IJ SOLN
INTRAMUSCULAR | Status: AC
  Filled 2022-06-07: qty 2

## 2022-06-07 MED ORDER — LIDOCAINE 2% (20 MG/ML) 5 ML SYRINGE
INTRAMUSCULAR | Status: DC | PRN
  Administered 2022-06-07: 50 mg via INTRAVENOUS

## 2022-06-07 MED ORDER — LIDOCAINE 2% (20 MG/ML) 5 ML SYRINGE
INTRAMUSCULAR | Status: AC
  Filled 2022-06-07: qty 5

## 2022-06-07 MED ORDER — AMISULPRIDE (ANTIEMETIC) 5 MG/2ML IV SOLN
INTRAVENOUS | Status: AC
  Filled 2022-06-07: qty 4

## 2022-06-07 MED ORDER — LACTATED RINGERS IV SOLN: INTRAVENOUS | Status: DC | PRN

## 2022-06-07 MED ORDER — OXYCODONE HCL 5 MG PO TABS
5.0000 mg | ORAL_TABLET | Freq: Once | ORAL | Status: AC
  Administered 2022-06-07: 5 mg via ORAL

## 2022-06-07 MED ORDER — ROCURONIUM BROMIDE 10 MG/ML (PF) SYRINGE
PREFILLED_SYRINGE | INTRAVENOUS | Status: AC
  Filled 2022-06-07: qty 10

## 2022-06-07 MED ORDER — DEXAMETHASONE SODIUM PHOSPHATE 10 MG/ML IJ SOLN
INTRAMUSCULAR | Status: DC | PRN
  Administered 2022-06-07: 10 mg via INTRAVENOUS

## 2022-06-07 MED ORDER — MIDAZOLAM HCL 2 MG/2ML IJ SOLN
INTRAMUSCULAR | Status: DC | PRN
  Administered 2022-06-07 (×2): 2 mg via INTRAVENOUS

## 2022-06-07 MED ORDER — LACTATED RINGERS IV SOLN: INTRAVENOUS | Status: DC

## 2022-06-07 MED ORDER — SOD CITRATE-CITRIC ACID 500-334 MG/5ML PO SOLN
ORAL | Status: AC
  Administered 2022-06-07: 30 mL via ORAL
  Filled 2022-06-07: qty 30

## 2022-06-07 MED ORDER — EPHEDRINE 5 MG/ML INJ
INTRAVENOUS | Status: AC
  Filled 2022-06-07: qty 5

## 2022-06-07 MED ORDER — ACETAMINOPHEN 500 MG PO TABS
ORAL_TABLET | ORAL | Status: AC
  Filled 2022-06-07: qty 2

## 2022-06-07 MED ORDER — CEFAZOLIN SODIUM-DEXTROSE 2-4 GM/100ML-% IV SOLN
2.0000 g | INTRAVENOUS | Status: AC
  Administered 2022-06-07: 2 g via INTRAVENOUS

## 2022-06-07 MED ORDER — FENTANYL CITRATE (PF) 100 MCG/2ML IJ SOLN
INTRAMUSCULAR | Status: AC
  Filled 2022-06-07: qty 2

## 2022-06-07 MED ORDER — DEXMEDETOMIDINE HCL IN NACL 80 MCG/20ML IV SOLN
INTRAVENOUS | Status: AC
  Filled 2022-06-07: qty 20

## 2022-06-07 MED ORDER — ACETAMINOPHEN 500 MG PO TABS: 1000.0000 mg | ORAL_TABLET | ORAL | Status: DC

## 2022-06-07 MED ORDER — ACETAMINOPHEN 10 MG/ML IV SOLN
INTRAVENOUS | Status: AC
  Filled 2022-06-07: qty 100

## 2022-06-07 MED ORDER — LIDOCAINE-EPINEPHRINE 1 %-1:100000 IJ SOLN
INTRAMUSCULAR | Status: DC | PRN
  Administered 2022-06-07: 30 mL

## 2022-06-07 MED ORDER — ONDANSETRON HCL 4 MG/2ML IJ SOLN
INTRAMUSCULAR | Status: AC
  Filled 2022-06-07: qty 2

## 2022-06-07 MED ORDER — IBUPROFEN 800 MG PO TABS: 800.0000 mg | ORAL_TABLET | Freq: Three times a day (TID) | ORAL | 0 refills | Status: DC | PRN

## 2022-06-07 MED ORDER — OXYCODONE HCL 5 MG PO TABS
ORAL_TABLET | ORAL | Status: AC
  Filled 2022-06-07: qty 1

## 2022-06-07 MED ORDER — KETOROLAC TROMETHAMINE 30 MG/ML IJ SOLN
INTRAMUSCULAR | Status: AC
  Filled 2022-06-07: qty 1

## 2022-06-07 MED ORDER — ROCURONIUM BROMIDE 10 MG/ML (PF) SYRINGE
PREFILLED_SYRINGE | INTRAVENOUS | Status: DC | PRN
  Administered 2022-06-07: 60 mg via INTRAVENOUS

## 2022-06-07 MED ORDER — ACETAMINOPHEN 10 MG/ML IV SOLN
INTRAVENOUS | Status: DC | PRN
  Administered 2022-06-07: 1000 mg via INTRAVENOUS

## 2022-06-07 MED ORDER — CHLORHEXIDINE GLUCONATE 0.12 % MT SOLN: 15.0000 mL | Freq: Once | OROMUCOSAL | Status: AC

## 2022-06-07 MED ORDER — FENTANYL CITRATE (PF) 250 MCG/5ML IJ SOLN
INTRAMUSCULAR | Status: DC | PRN
  Administered 2022-06-07: 50 ug via INTRAVENOUS
  Administered 2022-06-07: 100 ug via INTRAVENOUS
  Administered 2022-06-07: 50 ug via INTRAVENOUS

## 2022-06-07 MED ORDER — ACETAMINOPHEN 10 MG/ML IV SOLN: 1000.0000 mg | Freq: Once | INTRAVENOUS | Status: DC | PRN

## 2022-06-07 MED ORDER — ONDANSETRON HCL 4 MG/2ML IJ SOLN
INTRAMUSCULAR | Status: DC | PRN
  Administered 2022-06-07: 4 mg via INTRAVENOUS

## 2022-06-07 MED ORDER — KETOROLAC TROMETHAMINE 15 MG/ML IJ SOLN
15.0000 mg | INTRAMUSCULAR | Status: AC
  Administered 2022-06-07: 15 mg via INTRAVENOUS

## 2022-06-07 MED ORDER — CEFAZOLIN SODIUM-DEXTROSE 2-4 GM/100ML-% IV SOLN
INTRAVENOUS | Status: AC
  Filled 2022-06-07: qty 100

## 2022-06-07 MED ORDER — PROPOFOL 10 MG/ML IV BOLUS
INTRAVENOUS | Status: DC | PRN
  Administered 2022-06-07: 130 mg via INTRAVENOUS
  Administered 2022-06-07: 20 mg via INTRAVENOUS

## 2022-06-07 MED ORDER — PROPOFOL 500 MG/50ML IV EMUL
INTRAVENOUS | Status: DC | PRN
  Administered 2022-06-07: 150 ug/kg/min via INTRAVENOUS

## 2022-06-07 MED ORDER — PROPOFOL 10 MG/ML IV BOLUS
INTRAVENOUS | Status: AC
  Filled 2022-06-07: qty 20

## 2022-06-07 MED ORDER — SOD CITRATE-CITRIC ACID 500-334 MG/5ML PO SOLN: 30.0000 mL | ORAL | Status: AC

## 2022-06-07 MED ORDER — AMISULPRIDE (ANTIEMETIC) 5 MG/2ML IV SOLN
10.0000 mg | Freq: Once | INTRAVENOUS | Status: AC | PRN
  Administered 2022-06-07: 10 mg via INTRAVENOUS

## 2022-06-07 MED ORDER — POVIDONE-IODINE 10 % EX SWAB
2.0000 | Freq: Once | CUTANEOUS | Status: AC
  Administered 2022-06-07: 2 via TOPICAL

## 2022-06-07 MED ORDER — ORAL CARE MOUTH RINSE: 15.0000 mL | Freq: Once | OROMUCOSAL | Status: AC

## 2022-06-07 MED ORDER — SUGAMMADEX SODIUM 200 MG/2ML IV SOLN
INTRAVENOUS | Status: DC | PRN
  Administered 2022-06-07: 200 mg via INTRAVENOUS

## 2022-06-07 MED ORDER — CHLORHEXIDINE GLUCONATE 0.12 % MT SOLN
OROMUCOSAL | Status: AC
  Administered 2022-06-07: 15 mL via OROMUCOSAL
  Filled 2022-06-07: qty 15

## 2022-06-07 MED ORDER — DIPHENHYDRAMINE HCL 50 MG/ML IJ SOLN
INTRAMUSCULAR | Status: DC | PRN
  Administered 2022-06-07: 25 mg via INTRAVENOUS

## 2022-06-07 MED ORDER — DEXMEDETOMIDINE HCL IN NACL 80 MCG/20ML IV SOLN
INTRAVENOUS | Status: DC | PRN
  Administered 2022-06-07: 8 ug via BUCCAL
  Administered 2022-06-07: 4 ug via BUCCAL
  Administered 2022-06-07: 8 ug via BUCCAL

## 2022-06-07 SURGICAL SUPPLY — 27 items
BAG COUNTER SPONGE SURGICOUNT (BAG) ×1 IMPLANT
BAG SPNG CNTER NS LX DISP (BAG) ×1
CANISTER SUCT 3000ML PPV (MISCELLANEOUS) ×1 IMPLANT
DRAPE STERI URO 9X17 APER PCH (DRAPES) ×1 IMPLANT
GAUZE PACKING 2X5 YD STRL (GAUZE/BANDAGES/DRESSINGS) IMPLANT
GLOVE ECLIPSE 6.5 STRL STRAW (GLOVE) ×1 IMPLANT
GLOVE SURG ENC TEXT LTX SZ6 (GLOVE) ×1 IMPLANT
GLOVE SURG SS PI 7.0 STRL IVOR (GLOVE) IMPLANT
GLOVE SURG UNDER LTX SZ6.5 (GLOVE) ×2 IMPLANT
GOWN STRL REUS W/ TWL LRG LVL3 (GOWN DISPOSABLE) ×4 IMPLANT
GOWN STRL REUS W/TWL LRG LVL3 (GOWN DISPOSABLE) ×4
KIT TURNOVER KIT B (KITS) ×1 IMPLANT
NS IRRIG 1000ML POUR BTL (IV SOLUTION) ×1 IMPLANT
PACK VAGINAL WOMENS (CUSTOM PROCEDURE TRAY) ×1 IMPLANT
PAD OB MATERNITY 4.3X12.25 (PERSONAL CARE ITEMS) ×2 IMPLANT
SPIKE FLUID TRANSFER (MISCELLANEOUS) IMPLANT
SUT VIC AB 0 CT1 18XCR BRD8 (SUTURE) IMPLANT
SUT VIC AB 0 CT1 27 (SUTURE)
SUT VIC AB 0 CT1 27XBRD ANBCTR (SUTURE) IMPLANT
SUT VIC AB 0 CT1 36 (SUTURE) IMPLANT
SUT VIC AB 0 CT1 8-18 (SUTURE) ×1
SUT VIC AB 2-0 SH 27 (SUTURE)
SUT VIC AB 2-0 SH 27XBRD (SUTURE) IMPLANT
SUT VICRYL 0 TIES 12 18 (SUTURE) IMPLANT
TOWEL GREEN STERILE FF (TOWEL DISPOSABLE) ×2 IMPLANT
TRAY FOLEY W/BAG SLVR 14FR (SET/KITS/TRAYS/PACK) ×1 IMPLANT
UNDERPAD 30X36 HEAVY ABSORB (UNDERPADS AND DIAPERS) ×1 IMPLANT

## 2022-06-07 NOTE — Anesthesia Postprocedure Evaluation (Signed)
Anesthesia Post Note  Patient: Madison Morris  Procedure(s) Performed: HYSTERECTOMY VAGINAL WITH SALPINGECTOMY (Bilateral: Uterus)     Patient location during evaluation: PACU Anesthesia Type: General Level of consciousness: awake and alert Pain management: pain level controlled Vital Signs Assessment: post-procedure vital signs reviewed and stable Respiratory status: spontaneous breathing, nonlabored ventilation, respiratory function stable and patient connected to nasal cannula oxygen Cardiovascular status: blood pressure returned to baseline and stable Postop Assessment: no apparent nausea or vomiting Anesthetic complications: no   No notable events documented.  Last Vitals:  Vitals:   06/07/22 1426 06/07/22 1430  BP:  100/66  Pulse: 69 72  Resp: 13 15  Temp:  36.4 C  SpO2: 100% 99%    Last Pain:  Vitals:   06/07/22 1430  TempSrc:   PainSc: 4                  Wadsworth Skolnick P Nature Vogelsang

## 2022-06-07 NOTE — Anesthesia Procedure Notes (Signed)
Procedure Name: Intubation Date/Time: 06/07/2022 12:42 PM  Performed by: Inda Coke, CRNAPre-anesthesia Checklist: Patient identified, Emergency Drugs available, Suction available, Timeout performed and Patient being monitored Patient Re-evaluated:Patient Re-evaluated prior to induction Oxygen Delivery Method: Circle system utilized Preoxygenation: Pre-oxygenation with 100% oxygen Induction Type: IV induction Ventilation: Mask ventilation without difficulty Laryngoscope Size: Mac and 3 Grade View: Grade I Tube type: Oral Tube size: 7.0 mm Airway Equipment and Method: Stylet Placement Confirmation: ETT inserted through vocal cords under direct vision, positive ETCO2, CO2 detector and breath sounds checked- equal and bilateral Secured at: 22 cm Tube secured with: Tape Dental Injury: Teeth and Oropharynx as per pre-operative assessment

## 2022-06-07 NOTE — Anesthesia Preprocedure Evaluation (Addendum)
Anesthesia Evaluation  Patient identified by MRN, date of birth, ID band Patient awake    Reviewed: Allergy & Precautions, NPO status , Patient's Chart, lab work & pertinent test results  History of Anesthesia Complications (+) PONV and history of anesthetic complications  Airway Mallampati: II  TM Distance: >3 FB Neck ROM: Full    Dental no notable dental hx.    Pulmonary asthma    Pulmonary exam normal        Cardiovascular  Rhythm:Regular Rate:Normal     Neuro/Psych  Headaches  Anxiety Depression    Severe anxiety and "mediphobia" per patient.    GI/Hepatic Neg liver ROS,GERD  Medicated,,  Endo/Other  negative endocrine ROS    Renal/GU negative Renal ROS  Female GU complaint     Musculoskeletal  (+) Arthritis , Osteoarthritis,    Abdominal Normal abdominal exam  (+)   Peds  Hematology  (+) Blood dyscrasia, anemia   Anesthesia Other Findings   Reproductive/Obstetrics                             Anesthesia Physical Anesthesia Plan  ASA: 3  Anesthesia Plan: General   Post-op Pain Management:    Induction: Intravenous  PONV Risk Score and Plan: 4 or greater and Ondansetron, Dexamethasone, TIVA, Midazolam, Treatment may vary due to age or medical condition and Amisulpride  Airway Management Planned: Mask and Oral ETT  Additional Equipment: None  Intra-op Plan:   Post-operative Plan: Extubation in OR  Informed Consent: I have reviewed the patients History and Physical, chart, labs and discussed the procedure including the risks, benefits and alternatives for the proposed anesthesia with the patient or authorized representative who has indicated his/her understanding and acceptance.     Dental advisory given  Plan Discussed with: CRNA  Anesthesia Plan Comments:        Anesthesia Quick Evaluation

## 2022-06-07 NOTE — Op Note (Signed)
Madison Morris PROCEDURE DATE: 06/07/2022  PREOPERATIVE DIAGNOSIS:  Dysmenorrhea POSTOPERATIVE DIAGNOSIS:  Same SURGEON:  Dr. Radene Gunning ASSISTANT:  Dr. Debera Lat.  An experienced assistant was required given the standard of surgical care given the complexity of the case.  This assistant was needed for exposure, dissection, suctioning, retraction, instrument exchange, and for overall help during the procedure. OPERATION:  Total Vaginal hysterectomy ANESTHESIA:  General endotracheal  INDICATIONS: The patient is a 36 y.o. [id number] with history of symptomatic uterine fibroids/menorrhagia. The patient made a decision to undergo definite surgical treatment. On the preoperative visit, the risks, benefits, indications, and alternatives of the procedure were reviewed with the patient.  On the day of surgery, the risks of surgery were again discussed with the patient including but not limited to: bleeding which may require transfusion or reoperation; infection which may require antibiotics; injury to bowel, bladder, ureters or other surrounding organs; need for additional procedures; thromboembolic phenomenon, incisional problems and other postoperative/anesthesia complications. Written informed consent was obtained.    OPERATIVE FINDINGS: A 6 week size uterus with normal tubes and ovaries bilaterally.  ESTIMATED BLOOD LOSS: 100 ml URINE OUTPUT:  250 ml of clear yellow urine. SPECIMENS:  Uterus and cervix and bilateral tubes sent to pathology COMPLICATIONS:  None immediate.  DESCRIPTION OF PROCEDURE:  The patient received intravenous antibiotics and had sequential compression devices applied to her lower extremities while in the preoperative area.  Informed consent was reviewed and signed in Preop area.   The patient was taken back to the OR where general anesthesia was found to be adequate. The patient was prepared and draped in the normal sterile fashion in the dorsal lithotomy position in [place]. A timeout was performed.   A short weighted speculum was placed into the vagina. Cervix was grasped at 12 and 6 oclock with single tooth tenaculum and double tooth tenaculum respectively. Circumferential colpotomy was made with the Bovie. Vaginal tissue was pushed cephalad with the sponge. I entered posteriorly sharply with mayo scissors. The vagina and posterior peritoneum was tagged with 0 vicryl. Long weighted speculum was then placed in the vagina. I then entered sharply anteriorly using metzenbaum scissors and a right angle retractor. The right angle was then replaced passing it beneath my digit.  Then I bilaterally clamped the uterosacral ligaments with Roger clamps, cut and suture ligated with 0 vicryl. The cardinal ligaments were then bilaterally clamped, cut and suture ligated. Then the uterine arteries were bilaterally clamped, cut and suture ligated the uterine arteries. I then bilaterally clamped the broad ligament connecting the peritoneum anteriorly and posteriorly. The pedicle was cut and suture ligated. I was then able to flip the uterus out. I bilaterally doubly clamped the remaining pedicles. I then cut and removed the uterus and cervix.  The adnexal pedicles were then suture ligated and the clamps removed. The pedicles along the suture line were all inspected and noted to be hemostatic. The tubes were bilaterally grasped with  babcock clamps and clamped along the long axis of the tube including the fimbria. The pedicles were tied with 0 vicryl. Both sites inspected and hemostatic. Pedicle line once again inspected and hemostatic.   Short weighted speculum inserted. Anterior peritoneum identified and grasped with Allis clamp along with vaginal cuff. This was done posteriorly as well. Starting at each angle with 0 vicryl, sutured closed the cuff in a running locked fashion to the midline. Cuff inspected and hemostatic.   A catheter was placed and the urine was noted  to be clear of  any blood. The catheter was then removed.  At this point the procedure was ended. All sponge, lap, needle and instrument counts were correct x2. The patient was taken to the recovery room in good condition. ABX given at the start of the procedure.  Radene Gunning, MD, [place] for [facility], [place]

## 2022-06-07 NOTE — H&P (Signed)
[facility] Gynecology Attending History and Physical  Madison Morris is a 36 y.o. [phone] here today surgery for pain that starts after her menses 24-48 hours after. She was previously diagnosed with likely adenomyosis and declined intervention at that time (about a year ago). Cycles are irregular. Her pain is not controlled with NSAIDs. She does not want anything hormonal.    She has had the pain for several years prior to her first [place] assessment in 2019.    She had an [place] within the last year at [facility] which report was normal. Per a note in her chart from 2022 at [facility] (June 2022) she had an [place] in 2019 which diagnosed her with adenomyosis. This [place] is not available to me.    She has migraine with aura and cannot do combined OCPs.  She tried the Mirena previously and had it in for 6 months but had to have it removed due to side effects.   She notes pain with intercourse.   Past Medical History:  Diagnosis Date   Allergy    Mold, dust mites, [place] grass, gluten   Anemia July 2014   I believe I don't have the problem anymore   Anginal pain ([place])    in teens   Anxiety    Arthritis March 2018   At least in my knees, noted after accident   Asthma    COVID 07/2020   Depression    GERD (gastroesophageal reflux disease)    H. pylori infection    IBS (irritable bowel syndrome)    Migraine    PONV (postoperative nausea and vomiting)    Past Surgical History:  Procedure Laterality Date   DILATION AND CURETTAGE OF UTERUS     HERNIA REPAIR  Augest 2018   Repaired, still have issues with it today   umbilcal hernia     WISDOM TOOTH EXTRACTION     OB History  Gravida Para Term Preterm AB Living  '3 3 3     3  '$ SAB IAB Ectopic Multiple Live Births               # Outcome Date GA Lbr Len/2nd Weight Sex Delivery Anes PTL Lv  3 Term      Vag-Spont     2 Term      Vag-Spont     1 Term      Vag-Spont     Patient denies any other pertinent gynecologic issues.  No current  facility-administered medications on file prior to encounter.   Current Outpatient Medications on File Prior to Encounter  Medication Sig Dispense Refill   acetaminophen (TYLENOL) 325 MG tablet Take 650 mg by mouth every 6 (six) hours as needed (pain.).     albuterol (VENTOLIN HFA) 108 (90 Base) MCG/ACT inhaler Inhale 1-2 puffs into the lungs every 6 (six) hours as needed for shortness of breath or wheezing.     ascorbic acid (VITAMIN C) 500 MG tablet Take 500 mg by mouth daily at 12 noon.     bismuth subsalicylate (PEPTO BISMOL) 262 MG chewable tablet Chew 524 mg by mouth 3 (three) times daily as needed for diarrhea or loose stools or indigestion.     calcium carbonate (TUMS - DOSED IN MG ELEMENTAL CALCIUM) 500 MG chewable tablet Chew 2 tablets by mouth 2 (two) times daily as needed for indigestion or heartburn.     Camphor-Menthol-Methyl Sal (SALONPAS) 3.07-16-08 % PTCH Place 1 patch onto the skin daily as needed (  pain.).     Cholecalciferol (VITAMIN D3) 50 MCG (2000 UT) TABS Take 4,000 Units by mouth daily at 12 noon.     clonazePAM (KLONOPIN) 1 MG disintegrating tablet Take 1 mg by mouth no more than 2-3 times weekly as needed for severe anxiety only. (Patient taking differently: Take 0.5-2 mg by mouth daily as needed (anxiety/vestibular migraines.). Take 1 mg by mouth no more than 2-3 times weekly as needed for severe anxiety only.) 15 tablet 2   CREAM BASE EX Apply 1 Application topically 3 (three) times daily as needed (pain). Migrastil Soothing Neck and Shoulder Cream     famotidine-calcium carbonate-magnesium hydroxide (PEPCID COMPLETE) 10-800-165 MG chewable tablet Chew 1 tablet by mouth daily as needed (indigestion/heartburn).     Ferrous Sulfate (IRON PO) Take 10 mLs by mouth See admin instructions. Take 2 teaspoonfuls (10 mls) by mouth up to twice daily     fluticasone (FLONASE) 50 MCG/ACT nasal spray Place 1-2 sprays into both nostrils daily as needed for allergies.     Homeopathic  Products (HOMEOPATHIC CALM SL) Take 1 tablet by mouth at bedtime as needed (sleep).  CBD, L-Theanine, and 5-HTP     HOMEOPATHIC PRODUCTS PO Take 1 tablet by mouth at bedtime as needed (sleep). Magnesium, GABA Supplement, L-Theanine, Lemon Balm     hyoscyamine (LEVSIN SL) 0.125 MG SL tablet Place 0.125 mg under the tongue 2 (two) times daily as needed (abdominal pain/cramping).     ibuprofen (ADVIL) 200 MG tablet Take 600 mg by mouth every 8 (eight) hours as needed (pain.).     Lactase (LACTAID PO) Take 1 tablet by mouth daily as needed (dairy consumption.).     lansoprazole (PREVACID) 30 MG capsule Take 1 capsule (30 mg total) by mouth daily. (Patient taking differently: Take 30 mg by mouth at bedtime.) 90 capsule 1   Liniments (BLUE-EMU SUPER STRENGTH EX) Apply 4 Applications topically 3 (three) times daily as needed (pain).     loratadine (CLARITIN) 10 MG tablet Take 10 mg by mouth at bedtime.     Magnesium Oxide (MAG-OXIDE PO) Take 1 tablet by mouth at bedtime.     Menthol, Topical Analgesic, (BIOFREEZE EX) Apply 1 Application topically 3 (three) times daily as needed (pain.).     Multiple Vitamins-Minerals (CENTRUM MULTI + OMEGA 3 PO) Take 1 tablet by mouth daily at 12 noon.     ondansetron (ZOFRAN-ODT) 8 MG disintegrating tablet Take 8 mg by mouth every 8 (eight) hours as needed for nausea or vomiting.     PAPAYA ENZYMES PO Take 1 capsule by mouth 2 (two) times daily as needed (digestive health/regularity). Daily Cleanse Digestive Enzymes     Peppermint Oil (IBGARD PO) Take 1 capsule by mouth daily as needed (digestive health.).     phenylephrine (SUDAFED PE) 10 MG TABS tablet Take 10 mg by mouth every 4 (four) hours as needed (headaches/migraine).     Polyethyl Glycol-Propyl Glycol (LUBRICANT EYE DROPS) 0.4-0.3 % SOLN Place 1 drop into both eyes 3 (three) times daily as needed (dry/irritated eyes.).     Probiotic Product (UP4 PROBIOTICS WOMENS PO) Take 1 capsule by mouth in the morning. Once  Daily Women's Probiotic with 50 billion CFUs     pseudoephedrine (SUDAFED) 30 MG tablet Take 30 mg by mouth every 4 (four) hours as needed (headaches.).     rizatriptan (MAXALT-MLT) 10 MG disintegrating tablet Take 10 mg by mouth daily as needed for migraine.     simethicone (MYLICON) 80 MG chewable  tablet Chew 80 mg by mouth See admin instructions. Take 1 tablet (80 mg) by mouth up to twice daily if needed for gas issues     tiZANidine (ZANAFLEX) 2 MG tablet Take 2 mg by mouth daily as needed (migraines/vertigo).     Wheat Dextrin (BENEFIBER PO) Take 1 Dose by mouth daily.     Wheat Dextrin (BENEFIBER) CHEW Chew 1 tablet by mouth daily as needed (gut support/health).     Allergies  Allergen Reactions   Tretinoin Itching    Other reaction(s): Dermatitis Retin-A    Septra [Sulfamethoxazole-Trimethoprim] Nausea And Vomiting   Gluten Meal Nausea Only and Other (See Comments)    Stomach pain (gastrointestinal issues)/gas   Lactose Intolerance (Gi) Other (See Comments)    Gastrointestinal issues/gas (can tolerate some with lactaid)    Social History:   reports that she has never smoked. She has never used smokeless tobacco. She reports that she does not drink alcohol and does not use drugs. Family History  Problem Relation Age of Onset   Pulmonary Hypertension Mother    Early death Mother    Hypertension Mother    Asthma Father    Diabetes Maternal Grandmother    Hearing loss Maternal Grandmother    Cancer Paternal Grandfather    Anxiety disorder Daughter    Depression Brother     Review of Systems: Pertinent items noted in HPI and remainder of comprehensive ROS otherwise negative.  PHYSICAL EXAM: Blood pressure 117/78, pulse 92, temperature 98.1 F (36.7 C), temperature source Oral, resp. rate 18, height '5\' 6"'$  (1.676 m), weight 62.6 kg, last menstrual period 05/06/2022, SpO2 99 %. CONSTITUTIONAL: Well-developed, well-nourished female in no acute distress.  HENT:  Normocephalic,  atraumatic, External right and left ear normal. Oropharynx is clear and moist EYES: Conjunctivae and EOM are normal. Pupils are equal, round, and reactive to light. No scleral icterus.  NECK: Normal range of motion, supple, no masses SKIN: Skin is warm and dry. No rash noted. Not diaphoretic. No erythema. No pallor. NEUROLOGIC: Alert and oriented to person, place, and time. Normal reflexes, muscle tone coordination. No cranial nerve deficit noted. PSYCHIATRIC: Normal mood and affect. Normal behavior. Normal judgment and thought content. CARDIOVASCULAR: Normal heart rate noted, regular rhythm RESPIRATORY: Effort and breath sounds normal, no problems with respiration noted ABDOMEN: Soft, nontender, nondistended. PELVIC: Not examined MUSCULOSKELETAL: Normal range of motion. No tenderness.  No cyanosis, clubbing, or edema.  2+ distal pulses.  Labs: No results found for this or any previous visit (from the past 336 hour(s)).  Imaging Studies: No results found.  Assessment: Active Problems:   Adenomyosis   Plan: - Discussed differential: i.e. secondary dysmenorrhea vs endometriosis vs other etiology such as adenomyosis which she had been diagnosed in the past by [place] with this - Diagnosis: Pelvic pain, dysmenorrhea, dyspareunia - Planned surgery: TVH, bilateral salpingectomy. Discussed may need conversion to laparoscopic surgery or open surgery depending on findings at the time of surgery. If L/S, she has history of umbilical mesh and so my initial entry would be LUQ.  - Risks of surgery include but are not limited to: bleeding, infection, injury to surrounding organs/tissues (i.e. bowel/bladder/ureters), need for additional procedures, wound complications, hospital re-admission, and conversion to open surgery, VTE, earlier menopause, vaginal prolapse. We discussed if she has a complication above how they are typically managed and expected recovery. We discussed the potential need for separate  surgeries after the fact.  - We discussed postop restrictions, precautions and expectations - Preop  testing needed: None - All questions answered  Radene Gunning, MD, [place] for [facility], [place]

## 2022-06-07 NOTE — Transfer of Care (Signed)
Immediate Anesthesia Transfer of Care Note  Patient: Madison Morris  Procedure(s) Performed: HYSTERECTOMY VAGINAL WITH SALPINGECTOMY (Bilateral: Uterus)  Patient Location: PACU  Anesthesia Type:General  Level of Consciousness: drowsy  Airway & Oxygen Therapy: Patient Spontanous Breathing and Patient connected to face mask oxygen  Post-op Assessment: Report given to RN and Post -op Vital signs reviewed and stable  Post vital signs: Reviewed and stable  Last Vitals:  Vitals Value Taken Time  BP 98/55 06/07/22 1346  Temp    Pulse 60 06/07/22 1350  Resp 14 06/07/22 1350  SpO2 93 % 06/07/22 1350  Vitals shown include unvalidated device data.  Last Pain:  Vitals:   06/07/22 1145  TempSrc:   PainSc: 5       Patients Stated Pain Goal: 0 (97/94/99 7182)  Complications: No notable events documented.

## 2022-06-08 ENCOUNTER — Encounter (HOSPITAL_COMMUNITY): Payer: Self-pay | Admitting: Obstetrics and Gynecology

## 2022-06-09 ENCOUNTER — Emergency Department (HOSPITAL_BASED_OUTPATIENT_CLINIC_OR_DEPARTMENT_OTHER): Payer: 59

## 2022-06-09 ENCOUNTER — Other Ambulatory Visit: Payer: Self-pay

## 2022-06-09 ENCOUNTER — Observation Stay (HOSPITAL_BASED_OUTPATIENT_CLINIC_OR_DEPARTMENT_OTHER)
Admission: EM | Admit: 2022-06-09 | Discharge: 2022-06-11 | Disposition: A | Discharge status: Home / Self Care | Payer: 59 | Attending: Emergency Medicine | Admitting: Emergency Medicine

## 2022-06-09 ENCOUNTER — Encounter (HOSPITAL_BASED_OUTPATIENT_CLINIC_OR_DEPARTMENT_OTHER): Payer: Self-pay | Admitting: Emergency Medicine

## 2022-06-09 DIAGNOSIS — K219 Gastro-esophageal reflux disease without esophagitis: Secondary | ICD-10-CM | POA: Insufficient documentation

## 2022-06-09 DIAGNOSIS — N9489 Other specified conditions associated with female genital organs and menstrual cycle: Principal | ICD-10-CM | POA: Diagnosis present

## 2022-06-09 DIAGNOSIS — Y92239 Unspecified place in hospital as the place of occurrence of the external cause: Secondary | ICD-10-CM | POA: Diagnosis present

## 2022-06-09 DIAGNOSIS — Z90710 Acquired absence of both cervix and uterus: Secondary | ICD-10-CM

## 2022-06-09 DIAGNOSIS — J45909 Unspecified asthma, uncomplicated: Secondary | ICD-10-CM | POA: Insufficient documentation

## 2022-06-09 DIAGNOSIS — Z8249 Family history of ischemic heart disease and other diseases of the circulatory system: Secondary | ICD-10-CM

## 2022-06-09 DIAGNOSIS — Z833 Family history of diabetes mellitus: Secondary | ICD-10-CM

## 2022-06-09 DIAGNOSIS — D649 Anemia, unspecified: Secondary | ICD-10-CM | POA: Diagnosis present

## 2022-06-09 DIAGNOSIS — Y838 Other surgical procedures as the cause of abnormal reaction of the patient, or of later complication, without mention of misadventure at the time of the procedure: Secondary | ICD-10-CM | POA: Diagnosis present

## 2022-06-09 DIAGNOSIS — N8003 Adenomyosis of the uterus: Secondary | ICD-10-CM | POA: Insufficient documentation

## 2022-06-09 DIAGNOSIS — R079 Chest pain, unspecified: Secondary | ICD-10-CM | POA: Diagnosis not present

## 2022-06-09 DIAGNOSIS — D62 Acute posthemorrhagic anemia: Secondary | ICD-10-CM | POA: Diagnosis present

## 2022-06-09 DIAGNOSIS — Z888 Allergy status to other drugs, medicaments and biological substances status: Secondary | ICD-10-CM

## 2022-06-09 DIAGNOSIS — F32A Depression, unspecified: Secondary | ICD-10-CM | POA: Diagnosis present

## 2022-06-09 DIAGNOSIS — Z8616 Personal history of COVID-19: Secondary | ICD-10-CM | POA: Insufficient documentation

## 2022-06-09 DIAGNOSIS — Z79899 Other long term (current) drug therapy: Secondary | ICD-10-CM | POA: Insufficient documentation

## 2022-06-09 DIAGNOSIS — Z825 Family history of asthma and other chronic lower respiratory diseases: Secondary | ICD-10-CM

## 2022-06-09 DIAGNOSIS — R109 Unspecified abdominal pain: Secondary | ICD-10-CM | POA: Diagnosis not present

## 2022-06-09 DIAGNOSIS — N99840 Postprocedural hematoma of a genitourinary system organ or structure following a genitourinary system procedure: Principal | ICD-10-CM | POA: Diagnosis present

## 2022-06-09 DIAGNOSIS — R000 Tachycardia, unspecified: Secondary | ICD-10-CM | POA: Insufficient documentation

## 2022-06-09 DIAGNOSIS — F419 Anxiety disorder, unspecified: Secondary | ICD-10-CM | POA: Diagnosis present

## 2022-06-09 DIAGNOSIS — R778 Other specified abnormalities of plasma proteins: Secondary | ICD-10-CM | POA: Insufficient documentation

## 2022-06-09 DIAGNOSIS — E739 Lactose intolerance, unspecified: Secondary | ICD-10-CM | POA: Diagnosis present

## 2022-06-09 DIAGNOSIS — N838 Other noninflammatory disorders of ovary, fallopian tube and broad ligament: Secondary | ICD-10-CM | POA: Insufficient documentation

## 2022-06-09 HISTORY — DX: Acquired absence of both cervix and uterus: Z90.710

## 2022-06-09 HISTORY — DX: Other specified conditions associated with female genital organs and menstrual cycle: N94.89

## 2022-06-09 LAB — URINALYSIS, ROUTINE W REFLEX MICROSCOPIC
Bilirubin Urine: NEGATIVE
Glucose, UA: NEGATIVE mg/dL
Ketones, ur: NEGATIVE mg/dL
Nitrite: NEGATIVE
Protein, ur: NEGATIVE mg/dL
Specific Gravity, Urine: 1.01 (ref 1.005–1.030)
pH: 6.5 (ref 5.0–8.0)

## 2022-06-09 LAB — COMPREHENSIVE METABOLIC PANEL
ALT: 9 U/L (ref 0–44)
AST: 23 U/L (ref 15–41)
Albumin: 3.7 g/dL (ref 3.5–5.0)
Alkaline Phosphatase: 30 U/L — ABNORMAL LOW (ref 38–126)
Anion gap: 6 (ref 5–15)
BUN: 17 mg/dL (ref 6–20)
CO2: 28 mmol/L (ref 22–32)
Calcium: 8.5 mg/dL — ABNORMAL LOW (ref 8.9–10.3)
Chloride: 106 mmol/L (ref 98–111)
Creatinine, Ser: 0.69 mg/dL (ref 0.44–1.00)
GFR, Estimated: >60 mL/min (ref >60)
Glucose, Bld: 117 mg/dL — ABNORMAL HIGH (ref 70–99)
Potassium: 4.1 mmol/L (ref 3.5–5.1)
Sodium: 140 mmol/L (ref 135–145)
Total Bilirubin: 0.5 mg/dL (ref 0.3–1.2)
Total Protein: 6.9 g/dL (ref 6.5–8.1)

## 2022-06-09 LAB — LIPASE, BLOOD: Lipase: 59 U/L — ABNORMAL HIGH (ref 11–51)

## 2022-06-09 LAB — SURGICAL PATHOLOGY

## 2022-06-09 LAB — URINALYSIS, MICROSCOPIC (REFLEX)

## 2022-06-09 LAB — TROPONIN I (HIGH SENSITIVITY)
Troponin I (High Sensitivity): 67 ng/L — ABNORMAL HIGH (ref <18)
Troponin I (High Sensitivity): 70 ng/L — ABNORMAL HIGH (ref <18)

## 2022-06-09 LAB — LACTIC ACID, PLASMA
Lactic Acid, Venous: 1.5 mmol/L (ref 0.5–1.9)
Lactic Acid, Venous: 1.3 mmol/L (ref 0.5–1.9)

## 2022-06-09 MED ORDER — LACTATED RINGERS IV SOLN: INTRAVENOUS | Status: DC

## 2022-06-09 MED ORDER — FLUTICASONE PROPIONATE 50 MCG/ACT NA SUSP: 1.0000 | Freq: Every day | NASAL | Status: DC | PRN

## 2022-06-09 MED ORDER — TIZANIDINE HCL 4 MG PO TABS: 2.0000 mg | ORAL_TABLET | Freq: Every day | ORAL | Status: DC | PRN

## 2022-06-09 MED ORDER — IOHEXOL 350 MG/ML SOLN
100.0000 mL | Freq: Once | INTRAVENOUS | Status: AC | PRN
  Administered 2022-06-09: 100 mL via INTRAVENOUS

## 2022-06-09 MED ORDER — OXYCODONE-ACETAMINOPHEN 5-325 MG PO TABS: 1.0000 | ORAL_TABLET | ORAL | Status: DC | PRN

## 2022-06-09 MED ORDER — IBUPROFEN 200 MG PO TABS: 600.0000 mg | ORAL_TABLET | Freq: Four times a day (QID) | ORAL | Status: DC | PRN

## 2022-06-09 MED ORDER — BISACODYL 5 MG PO TBEC: 5.0000 mg | DELAYED_RELEASE_TABLET | Freq: Every day | ORAL | Status: DC | PRN

## 2022-06-09 MED ORDER — SODIUM CHLORIDE 0.9% IV SOLUTION: Freq: Once | INTRAVENOUS | Status: AC

## 2022-06-09 MED ORDER — LORAZEPAM 2 MG/ML IJ SOLN
0.5000 mg | Freq: Once | INTRAMUSCULAR | Status: AC | PRN
  Administered 2022-06-10: 0.5 mg via INTRAVENOUS
  Filled 2022-06-09: qty 1

## 2022-06-09 MED ORDER — ONDANSETRON HCL 4 MG PO TABS
4.0000 mg | ORAL_TABLET | Freq: Four times a day (QID) | ORAL | Status: DC | PRN
  Filled 2022-06-09: qty 1

## 2022-06-09 MED ORDER — HYOSCYAMINE SULFATE 0.125 MG SL SUBL: 0.1250 mg | SUBLINGUAL_TABLET | Freq: Two times a day (BID) | SUBLINGUAL | Status: DC | PRN

## 2022-06-09 MED ORDER — RIZATRIPTAN BENZOATE 10 MG PO TBDP: 10.0000 mg | ORAL_TABLET | Freq: Every day | ORAL | Status: DC | PRN

## 2022-06-09 MED ORDER — ALUM & MAG HYDROXIDE-SIMETH 200-200-20 MG/5ML PO SUSP: 30.0000 mL | ORAL | Status: DC | PRN

## 2022-06-09 MED ORDER — ZOLPIDEM TARTRATE 5 MG PO TABS: 5.0000 mg | ORAL_TABLET | Freq: Every evening | ORAL | Status: DC | PRN

## 2022-06-09 MED ORDER — ACETAMINOPHEN 325 MG PO TABS
650.0000 mg | ORAL_TABLET | Freq: Once | ORAL | Status: DC
  Filled 2022-06-09: qty 2

## 2022-06-09 MED ORDER — HYDROMORPHONE HCL 1 MG/ML IJ SOLN: 0.2000 mg | INTRAMUSCULAR | Status: DC | PRN

## 2022-06-09 MED ORDER — ALBUTEROL SULFATE (2.5 MG/3ML) 0.083% IN NEBU: 3.0000 mL | INHALATION_SOLUTION | Freq: Four times a day (QID) | RESPIRATORY_TRACT | Status: DC | PRN

## 2022-06-09 MED ORDER — ONDANSETRON HCL 4 MG/2ML IJ SOLN
4.0000 mg | Freq: Four times a day (QID) | INTRAMUSCULAR | Status: DC | PRN
  Administered 2022-06-10: 4 mg via INTRAVENOUS
  Filled 2022-06-09: qty 2

## 2022-06-09 MED ORDER — DOCUSATE SODIUM 100 MG PO CAPS
100.0000 mg | ORAL_CAPSULE | Freq: Two times a day (BID) | ORAL | Status: DC
  Administered 2022-06-10: 100 mg via ORAL
  Filled 2022-06-09: qty 1

## 2022-06-09 MED ORDER — MAGNESIUM CITRATE PO SOLN: 1.0000 | Freq: Once | ORAL | Status: DC | PRN

## 2022-06-09 MED ORDER — CLONAZEPAM 1 MG PO TBDP
1.0000 mg | ORAL_TABLET | Freq: Every day | ORAL | Status: DC | PRN
  Filled 2022-06-09: qty 1

## 2022-06-09 MED ORDER — FUROSEMIDE 10 MG/ML IJ SOLN
20.0000 mg | Freq: Once | INTRAMUSCULAR | Status: DC
  Filled 2022-06-09: qty 2

## 2022-06-09 MED ORDER — ACETAMINOPHEN 160 MG/5ML PO SOLN
650.0000 mg | Freq: Once | ORAL | Status: AC
  Administered 2022-06-09: 650 mg via ORAL
  Filled 2022-06-09: qty 20.3

## 2022-06-09 MED ORDER — LORATADINE 10 MG PO TABS
10.0000 mg | ORAL_TABLET | Freq: Every day | ORAL | Status: DC
  Administered 2022-06-10: 10 mg via ORAL
  Filled 2022-06-09 (×2): qty 1

## 2022-06-09 MED ORDER — SODIUM CHLORIDE 0.9 % IV BOLUS
1000.0000 mL | Freq: Once | INTRAVENOUS | Status: AC
  Administered 2022-06-09: 1000 mL via INTRAVENOUS

## 2022-06-09 MED ORDER — DIPHENHYDRAMINE HCL 25 MG PO CAPS
25.0000 mg | ORAL_CAPSULE | Freq: Once | ORAL | Status: DC
  Filled 2022-06-09: qty 1

## 2022-06-09 MED ORDER — MAGNESIUM HYDROXIDE 400 MG/5ML PO SUSP: 30.0000 mL | Freq: Every day | ORAL | Status: DC | PRN

## 2022-06-09 MED ORDER — SODIUM CHLORIDE 0.9 % IV BOLUS
500.0000 mL | Freq: Once | INTRAVENOUS | Status: AC
  Administered 2022-06-09: 500 mL via INTRAVENOUS

## 2022-06-09 NOTE — ED Triage Notes (Signed)
Patient states she had a hysterectomy on Tuesday. Patient reports she felt like she could hear her heart beat in her ears since yesterday and began to have a headache. Patient states she was told she could possibly have an ear infection, and spoke with her PCP and recommended she be evaluated. States she has felt worse tonight with her heart race increasing.

## 2022-06-09 NOTE — ED Notes (Signed)
Patient transported to CT 

## 2022-06-09 NOTE — H&P (Incomplete)
Faculty Practice Gynecology Attending History and Physical  Madison Morris is a 36 y.o. [phone] POD#2 s/p TVH who is being admitted with postoperative hematoma, symptomatic anemia due to greater than expected blood loss during surgery.  Patient underwent TVH and was discharged same day as she was stable. She reported having severe abdominal pain after surgery, but this has improved.  However on 06/08/22, she started having palpitations and feeling like her breath "stops short" during deep breathing. The palpitations increased in severity, she felt she "could hear her heart beat in her ears".  She reported feeling worse because of her heart rate increasing.  In [facility], she was noted to be tachycardic to 140s, but rest of vitals were stable.  Benign abdominal exam, small amount of blood noted on pad.  Labs remarkable for hemoglobin of 6.7, was 15.0 on 05/24/22 (preop labs).Also noted to have elevated Troponin I of 67>70. EKG showed sinus tachycardia with T wave abnormality, consider inferior ischemia.  Patient denies any chest pain, SOB. Our service was consulted, my recommendation was to get CT of her cheat and abdomen/pelvic to evaluate for She was given IV fluids and sent for imaging: CT of chest was negative for PE but abdomen/pelvic CT showed a large postoperative intrapelvic hematoma after hysterectomy, measuring 10.4 by 12.8 x 11.7 cm. There was high attenuation free fluid within the bilateral flanks and left upper quadrant, consistent with hemoperitoneum after recent hysterectomy   at Unknown who presented to [facility] today for evaluation of ***.  Denies any abnormal vaginal discharge, fevers, chills, sweats, dysuria, nausea, vomiting, other GI or GU symptoms or other general symptoms.  Past Medical History:  Diagnosis Date  . Allergy    Mold, dust mites, [place] grass, gluten  . Anemia July 2014   I believe I don't have the problem anymore  . Anginal pain ([place])    in teens  . Anxiety   . Arthritis  March 2018   At least in my knees, noted after accident  . Asthma   . COVID 07/2020  . Depression   . GERD (gastroesophageal reflux disease)   . H. pylori infection   . IBS (irritable bowel syndrome)   . Migraine   . PONV (postoperative nausea and vomiting)    Past Surgical History:  Procedure Laterality Date  . DILATION AND CURETTAGE OF UTERUS    . HERNIA REPAIR  Augest 2018   Repaired, still have issues with it today  . umbilcal hernia    . VAGINAL HYSTERECTOMY Bilateral 06/07/2022   Procedure: HYSTERECTOMY VAGINAL WITH SALPINGECTOMY;  Surgeon: Radene Gunning, MD;  Location: [facility];  Service: Gynecology;  Laterality: Bilateral;  . WISDOM TOOTH EXTRACTION     OB History  Gravida Para Term Preterm AB Living  '3 3 3     3  '$ SAB IAB Ectopic Multiple Live Births               # Outcome Date GA Lbr Len/2nd Weight Sex Delivery Anes PTL Lv  3 Term      Vag-Spont     2 Term      Vag-Spont     1 Term      Vag-Spont     Patient denies any other pertinent gynecologic issues.  No current facility-administered medications on file prior to encounter.   Current Outpatient Medications on File Prior to Encounter  Medication Sig Dispense Refill  . acetaminophen (TYLENOL) 500 MG tablet Take 1 tablet (500 mg total)  by mouth every 8 (eight) hours as needed for mild pain. 30 tablet 0  . albuterol (VENTOLIN HFA) 108 (90 Base) MCG/ACT inhaler Inhale 1-2 puffs into the lungs every 6 (six) hours as needed for shortness of breath or wheezing.    Marland Kitchen ascorbic acid (VITAMIN C) 500 MG tablet Take 500 mg by mouth daily at 12 noon.    . bismuth subsalicylate (PEPTO BISMOL) 262 MG chewable tablet Chew 524 mg by mouth 3 (three) times daily as needed for diarrhea or loose stools or indigestion.    . calcium carbonate (TUMS - DOSED IN MG ELEMENTAL CALCIUM) 500 MG chewable tablet Chew 2 tablets by mouth 2 (two) times daily as needed for indigestion or heartburn.    . Camphor-Menthol-Methyl Sal (SALONPAS) 3.07-16-08 %  PTCH Place 1 patch onto the skin daily as needed (pain.).    Marland Kitchen Cholecalciferol (VITAMIN D3) 50 MCG (2000 UT) TABS Take 4,000 Units by mouth daily at 12 noon.    . clonazePAM (KLONOPIN) 1 MG disintegrating tablet Take 1 mg by mouth no more than 2-3 times weekly as needed for severe anxiety only. (Patient taking differently: Take 0.5-2 mg by mouth daily as needed (anxiety/vestibular migraines.). Take 1 mg by mouth no more than 2-3 times weekly as needed for severe anxiety only.) 15 tablet 2  . CREAM BASE EX Apply 1 Application topically 3 (three) times daily as needed (pain). Migrastil Soothing Neck and Shoulder Cream    . famotidine-calcium carbonate-magnesium hydroxide (PEPCID COMPLETE) 10-800-165 MG chewable tablet Chew 1 tablet by mouth daily as needed (indigestion/heartburn).    . Ferrous Sulfate (IRON PO) Take 10 mLs by mouth See admin instructions. Take 2 teaspoonfuls (10 mls) by mouth up to twice daily    . fluticasone (FLONASE) 50 MCG/ACT nasal spray Place 1-2 sprays into both nostrils daily as needed for allergies.    . Homeopathic Products (HOMEOPATHIC CALM SL) Take 1 tablet by mouth at bedtime as needed (sleep).  CBD, L-Theanine, and 5-HTP    . HOMEOPATHIC PRODUCTS PO Take 1 tablet by mouth at bedtime as needed (sleep). Magnesium, GABA Supplement, L-Theanine, Lemon Balm    . hyoscyamine (LEVSIN SL) 0.125 MG SL tablet Place 0.125 mg under the tongue 2 (two) times daily as needed (abdominal pain/cramping).    Marland Kitchen ibuprofen (ADVIL) 800 MG tablet Take 1 tablet (800 mg total) by mouth 3 (three) times daily with meals as needed for headache, moderate pain or cramping. 60 tablet 0  . Lactase (LACTAID PO) Take 1 tablet by mouth daily as needed (dairy consumption.).    Marland Kitchen lansoprazole (PREVACID) 30 MG capsule Take 1 capsule (30 mg total) by mouth daily. (Patient taking differently: Take 30 mg by mouth at bedtime.) 90 capsule 1  . Liniments (BLUE-EMU SUPER STRENGTH EX) Apply 4 Applications topically 3  (three) times daily as needed (pain).    Marland Kitchen loratadine (CLARITIN) 10 MG tablet Take 10 mg by mouth at bedtime.    . Magnesium Oxide (MAG-OXIDE PO) Take 1 tablet by mouth at bedtime.    . Menthol, Topical Analgesic, (BIOFREEZE EX) Apply 1 Application topically 3 (three) times daily as needed (pain.).    Marland Kitchen Multiple Vitamins-Minerals (CENTRUM MULTI + OMEGA 3 PO) Take 1 tablet by mouth daily at 12 noon.    . ondansetron (ZOFRAN-ODT) 8 MG disintegrating tablet Take 8 mg by mouth every 8 (eight) hours as needed for nausea or vomiting.    Marland Kitchen oxyCODONE (OXY IR/ROXICODONE) 5 MG immediate release tablet Take 1  tablet (5 mg total) by mouth every 4 (four) hours as needed for severe pain or breakthrough pain. 20 tablet 0  . PAPAYA ENZYMES PO Take 1 capsule by mouth 2 (two) times daily as needed (digestive health/regularity). Daily Cleanse Digestive Enzymes    . Peppermint Oil (IBGARD PO) Take 1 capsule by mouth daily as needed (digestive health.).    Marland Kitchen phenylephrine (SUDAFED PE) 10 MG TABS tablet Take 10 mg by mouth every 4 (four) hours as needed (headaches/migraine).    Vladimir Faster Glycol-Propyl Glycol (LUBRICANT EYE DROPS) 0.4-0.3 % SOLN Place 1 drop into both eyes 3 (three) times daily as needed (dry/irritated eyes.).    Marland Kitchen Probiotic Product (UP4 PROBIOTICS WOMENS PO) Take 1 capsule by mouth in the morning. Once Daily Women's Probiotic with 50 billion CFUs    . pseudoephedrine (SUDAFED) 30 MG tablet Take 30 mg by mouth every 4 (four) hours as needed (headaches.).    Marland Kitchen rizatriptan (MAXALT-MLT) 10 MG disintegrating tablet Take 10 mg by mouth daily as needed for migraine.    . simethicone (MYLICON) 80 MG chewable tablet Chew 80 mg by mouth See admin instructions. Take 1 tablet (80 mg) by mouth up to twice daily if needed for gas issues    . tiZANidine (ZANAFLEX) 2 MG tablet Take 2 mg by mouth daily as needed (migraines/vertigo).    . Wheat Dextrin (BENEFIBER PO) Take 1 Dose by mouth daily.    . Wheat Dextrin  (BENEFIBER) CHEW Chew 1 tablet by mouth daily as needed (gut support/health).     Allergies  Allergen Reactions  . Tretinoin Itching    Other reaction(s): Dermatitis Retin-A   . Septra [Sulfamethoxazole-Trimethoprim] Nausea And Vomiting  . Gluten Meal Nausea Only and Other (See Comments)    Stomach pain (gastrointestinal issues)/gas  . Lactose Intolerance (Gi) Other (See Comments)    Gastrointestinal issues/gas (can tolerate some with lactaid)    Social History:   reports that she has never smoked. She has never used smokeless tobacco. She reports that she does not drink alcohol and does not use drugs. Family History  Problem Relation Age of Onset  . Pulmonary Hypertension Mother   . Early death Mother   . Hypertension Mother   . Asthma Father   . Diabetes Maternal Grandmother   . Hearing loss Maternal Grandmother   . Cancer Paternal Grandfather   . Anxiety disorder Daughter   . Depression Brother     Review of Systems: Pertinent items noted in HPI and remainder of comprehensive ROS otherwise negative.  PHYSICAL EXAM: Blood pressure (!) 99/54, pulse (!) 133, temperature 98.1 F (36.7 C), temperature source Oral, resp. rate 14, height '5\' 6"'$  (1.676 m), weight 62.6 kg, last menstrual period 05/06/2022, SpO2 98 %. CONSTITUTIONAL: Well-developed, well-nourished female in no acute distress.  HENT:  Normocephalic, atraumatic, External right and left ear normal. Oropharynx is clear and moist EYES: Conjunctivae and EOM are normal. Pupils are equal, round, and reactive to light. No scleral icterus.  NECK: Normal range of motion, supple, no masses SKIN: Skin is warm and dry. No rash noted. Not diaphoretic. No erythema. No pallor. NEUROLOGIC: Alert and oriented to person, place, and time. Normal reflexes, muscle tone coordination. No cranial nerve deficit noted. PSYCHIATRIC: Normal mood and affect. Normal behavior. Normal judgment and thought content. CARDIOVASCULAR: Normal heart rate  noted, regular rhythm RESPIRATORY: Effort and breath sounds normal, no problems with respiration noted ABDOMEN: Soft, nontender, nondistended. PELVIC: ***blood on pad MUSCULOSKELETAL: Normal range of motion. No  tenderness.  No cyanosis, clubbing, or edema.  2+ distal pulses.  Labs: Results for orders placed or performed during the hospital encounter of 06/09/22 (from the past 336 hour(s))  Comprehensive metabolic panel   Collection Time: 06/09/22  9:01 PM  Result Value Ref Range   Sodium 140 135 - 145 mmol/L   Potassium 4.1 3.5 - 5.1 mmol/L   Chloride 106 98 - 111 mmol/L   CO2 28 22 - 32 mmol/L   Glucose, Bld 117 (H) 70 - 99 mg/dL   BUN 17 6 - 20 mg/dL   Creatinine, Ser 0.69 0.44 - 1.00 mg/dL   Calcium 8.5 (L) 8.9 - 10.3 mg/dL   Total Protein 6.9 6.5 - 8.1 g/dL   Albumin 3.7 3.5 - 5.0 g/dL   AST 23 15 - 41 U/L   ALT 9 0 - 44 U/L   Alkaline Phosphatase 30 (L) 38 - 126 U/L   Total Bilirubin 0.5 0.3 - 1.2 mg/dL   GFR, Estimated >60 >60 mL/min   Anion gap 6 5 - 15  Lactic acid, plasma   Collection Time: 06/09/22  9:01 PM  Result Value Ref Range   Lactic Acid, Venous 1.5 0.5 - 1.9 mmol/L  Lipase, blood   Collection Time: 06/09/22  9:01 PM  Result Value Ref Range   Lipase 59 (H) 11 - 51 U/L  CBC with Differential   Collection Time: 06/09/22  9:01 PM  Result Value Ref Range   WBC 8.4 4.0 - 10.5 K/uL   RBC 2.21 (L) 3.87 - 5.11 MIL/uL   Hemoglobin 6.7 (LL) 12.0 - 15.0 g/dL   HCT 20.0 (L) 36.0 - 46.0 %   MCV 90.5 80.0 - 100.0 fL   MCH 30.3 26.0 - 34.0 pg   MCHC 33.5 30.0 - 36.0 g/dL   RDW 12.6 11.5 - 15.5 %   Platelets 183 150 - 400 K/uL   nRBC 0.0 0.0 - 0.2 %   Neutrophils Relative % 57 %   Neutro Abs 4.8 1.7 - 7.7 K/uL   Lymphocytes Relative 34 %   Lymphs Abs 2.9 0.7 - 4.0 K/uL   Monocytes Relative 8 %   Monocytes Absolute 0.7 0.1 - 1.0 K/uL   Eosinophils Relative 1 %   Eosinophils Absolute 0.1 0.0 - 0.5 K/uL   Basophils Relative 0 %   Basophils Absolute 0.0 0.0 - 0.1  K/uL   Immature Granulocytes 0 %   Abs Immature Granulocytes 0.02 0.00 - 0.07 K/uL  Troponin I (High Sensitivity)   Collection Time: 06/09/22  9:01 PM  Result Value Ref Range   Troponin I (High Sensitivity) 67 (H) <18 ng/L  Urinalysis, Routine w reflex microscopic Urine, Clean Catch   Collection Time: 06/09/22 10:28 PM  Result Value Ref Range   Color, Urine YELLOW YELLOW   APPearance HAZY (A) CLEAR   Specific Gravity, Urine 1.010 1.005 - 1.030   pH 6.5 5.0 - 8.0   Glucose, UA NEGATIVE NEGATIVE mg/dL   Hgb urine dipstick TRACE (A) NEGATIVE   Bilirubin Urine NEGATIVE NEGATIVE   Ketones, ur NEGATIVE NEGATIVE mg/dL   Protein, ur NEGATIVE NEGATIVE mg/dL   Nitrite NEGATIVE NEGATIVE   Leukocytes,Ua TRACE (A) NEGATIVE  Urinalysis, Microscopic (reflex)   Collection Time: 06/09/22 10:28 PM  Result Value Ref Range   RBC / HPF 0-5 0 - 5 RBC/hpf   WBC, UA 0-5 0 - 5 WBC/hpf   Bacteria, UA RARE (A) NONE SEEN   Squamous Epithelial / LPF 0-5  0 - 5  Lactic acid, plasma   Collection Time: 06/09/22 11:07 PM  Result Value Ref Range   Lactic Acid, Venous 1.3 0.5 - 1.9 mmol/L  Troponin I (High Sensitivity)   Collection Time: 06/09/22 11:07 PM  Result Value Ref Range   Troponin I (High Sensitivity) 70 (H) <18 ng/L  Results for orders placed or performed during the hospital encounter of 06/07/22 (from the past 336 hour(s))  ABO/Rh   Collection Time: 06/07/22 12:12 PM  Result Value Ref Range   ABO/RH(D)      B POS Performed at [facility] [address], [place], [place] [address]   Pregnancy, urine POC   Collection Time: 06/07/22 12:31 PM  Result Value Ref Range   Preg Test, Ur NEGATIVE NEGATIVE  Surgical pathology   Collection Time: 06/07/22  1:03 PM  Result Value Ref Range   SURGICAL PATHOLOGY      SURGICAL PATHOLOGY CASE: [id number] PATIENT: Romelle Vert Surgical Pathology Report     Clinical History: dysmenorrhea (cm)     FINAL MICROSCOPIC DIAGNOSIS:  A. UTERUS  AND CERVIX, WITH BILATERAL FALLOPIAN TUBES, HYSTERECTOMY: - Cervix: Nabothian cyst - Endomyometrium: Benign proliferative endometrium, adenomyosis - Fallopian tubes: Benign, paratubal cyst   GROSS DESCRIPTION:  A.  Specimen: received fresh and subsequently placed in formalin labeled with the patient's name and "Uterus, cervix, bilateral fallopian tubes." Integrity: intact Uterus Size and Shape: 4.2 x 4.0 x 3.3 cm Weight: 70 g Serosa: pink-tan and glistening. Cervix: 3.0 cm long and 3.3 cm in diameter with pink, unremarkable ectocervical mucosa surrounding a 1.2 cm patent, ovoid external os. Endometrium: red-tan, pillowy endometrium lines the 2.4 cm long, 2.6 cm wide endometrial cavity. Myometrium: the 1.3-1.8 cm thick myometrium is grossly unremarkable. Detached undesignated fallopian  tubes: 8.4 x 0.3 cm with prominent fimbria, 6.2 x 0.3 cm with no discernible fimbria; red-tan with unremarkable cut and serosal surfaces. Block Summary: A1: anterior and posterior cervix A2: anterior uterus A3: posterior uterus A4: fimbriated FT and representative cross sections A5: second FT cross sections  (LEF 06/08/2022)   Final Diagnosis performed by Tilford Pillar, MD.   Electronically signed 06/09/2022 Technical component performed at [employer]. [facility], [place] [address], [place], [place] [address].  Professional component performed at [facility], [place] [address] [place], [place] [address].  Immunohistochemistry Technical component (if applicable) was performed at [facility]. [address], [place], [place], [place] [address].   IMMUNOHISTOCHEMISTRY DISCLAIMER (if applicable): Some of these immunohistochemical stains may have been developed and the performance characteristics determine by [employer] [facility]. Some may not have been cleared or approved by the U.S. Food and Drug Administration. The FDA has determined that such  clearance or approval is not necessary. This test is used for clinical purposes. It should not be regarded as investigational or for research. This laboratory is certified under the [place] (CLIA-88) as qualified to perform high complexity clinical laboratory testing.  The controls stained appropriately.     Imaging Studies: CT Angio Chest PE W and/or Wo Contrast  Result Date: 06/09/2022 CLINICAL DATA:  Chest pain, tachycardia, headache, hysterectomy 2 days ago EXAM: CT ANGIOGRAPHY CHEST CT ABDOMEN AND PELVIS WITH CONTRAST TECHNIQUE: Multidetector CT imaging of the chest was performed using the standard protocol during bolus administration of intravenous contrast. Multiplanar CT image reconstructions and MIPs were obtained to evaluate the vascular anatomy. Multidetector CT imaging of the abdomen and pelvis  was performed using the standard protocol during bolus administration of intravenous contrast. RADIATION DOSE REDUCTION: This exam was performed according to the departmental dose-optimization program which includes automated exposure control, adjustment of the mA and/or kV according to patient size and/or use of iterative reconstruction technique. CONTRAST:  165m OMNIPAQUE IOHEXOL 350 MG/ML SOLN COMPARISON:  06/09/2022 FINDINGS: CTA CHEST FINDINGS Cardiovascular: This is a technically adequate evaluation of the pulmonary vasculature. No filling defects or pulmonary emboli. The heart is unremarkable without pericardial effusion. No evidence of thoracic aortic aneurysm or dissection. Mediastinum/Nodes: No enlarged mediastinal, hilar, or axillary lymph nodes. Thyroid gland, trachea, and esophagus demonstrate no significant findings. Lungs/Pleura: No acute airspace disease, effusion, or pneumothorax. Minimal hypoventilatory changes are seen at the lung bases. Musculoskeletal: No acute or destructive bony lesions. Reconstructed images demonstrate no additional  findings. Review of the MIP images confirms the above findings. CT ABDOMEN and PELVIS FINDINGS Hepatobiliary: No focal liver abnormality is seen. No gallstones, gallbladder wall thickening, or biliary dilatation. Pancreas: Unremarkable. No pancreatic ductal dilatation or surrounding inflammatory changes. Spleen: Normal in size without focal abnormality. Adrenals/Urinary Tract: The kidneys enhance normally. No urinary tract calculi or obstructive uropathy. The adrenals and bladder are unremarkable. Stomach/Bowel: No bowel obstruction or ileus. No bowel wall thickening or inflammatory change. Vascular/Lymphatic: No significant vascular findings. No pathologic adenopathy. Reproductive: There is a large postoperative intrapelvic hematoma after hysterectomy, measuring 10.4 by 12.8 x 11.7 cm. No adnexal mass. Other: Free intraperitoneal gas consistent with recent hysterectomy. Large intrapelvic hematoma as above. High attenuation free fluid within the bilateral flanks and left upper quadrant, consistent with hemoperitoneum after recent hysterectomy. No abdominal wall hernia. Musculoskeletal: No acute or destructive bony lesions. Reconstructed images demonstrate no additional findings. Review of the MIP images confirms the above findings. IMPRESSION: Chest: 1. No evidence of pulmonary embolus. 2. No acute intrathoracic process. Abdomen/pelvis: 1. Large intrapelvic hematoma after recent hysterectomy, with high attenuation fluid within the bilateral flanks and left upper quadrant consistent with postoperative hemoperitoneum. 2. Pneumoperitoneum consistent with recent surgical intervention. Critical Value/emergent results were called by telephone at the time of interpretation on 06/09/2022 at 11:14 pm to provider DR REES, who verbally acknowledged these results. Electronically Signed   By: MRanda NgoM.D.   On: 06/09/2022 23:16   CT ABDOMEN PELVIS W CONTRAST  Result Date: 06/09/2022 CLINICAL DATA:  Chest pain,  tachycardia, headache, hysterectomy 2 days ago EXAM: CT ANGIOGRAPHY CHEST CT ABDOMEN AND PELVIS WITH CONTRAST TECHNIQUE: Multidetector CT imaging of the chest was performed using the standard protocol during bolus administration of intravenous contrast. Multiplanar CT image reconstructions and MIPs were obtained to evaluate the vascular anatomy. Multidetector CT imaging of the abdomen and pelvis was performed using the standard protocol during bolus administration of intravenous contrast. RADIATION DOSE REDUCTION: This exam was performed according to the departmental dose-optimization program which includes automated exposure control, adjustment of the mA and/or kV according to patient size and/or use of iterative reconstruction technique. CONTRAST:  1044mOMNIPAQUE IOHEXOL 350 MG/ML SOLN COMPARISON:  06/09/2022 FINDINGS: CTA CHEST FINDINGS Cardiovascular: This is a technically adequate evaluation of the pulmonary vasculature. No filling defects or pulmonary emboli. The heart is unremarkable without pericardial effusion. No evidence of thoracic aortic aneurysm or dissection. Mediastinum/Nodes: No enlarged mediastinal, hilar, or axillary lymph nodes. Thyroid gland, trachea, and esophagus demonstrate no significant findings. Lungs/Pleura: No acute airspace disease, effusion, or pneumothorax. Minimal hypoventilatory changes are seen at the lung bases. Musculoskeletal: No acute or destructive bony lesions. Reconstructed images demonstrate  no additional findings. Review of the MIP images confirms the above findings. CT ABDOMEN and PELVIS FINDINGS Hepatobiliary: No focal liver abnormality is seen. No gallstones, gallbladder wall thickening, or biliary dilatation. Pancreas: Unremarkable. No pancreatic ductal dilatation or surrounding inflammatory changes. Spleen: Normal in size without focal abnormality. Adrenals/Urinary Tract: The kidneys enhance normally. No urinary tract calculi or obstructive uropathy. The adrenals and  bladder are unremarkable. Stomach/Bowel: No bowel obstruction or ileus. No bowel wall thickening or inflammatory change. Vascular/Lymphatic: No significant vascular findings. No pathologic adenopathy. Reproductive: There is a large postoperative intrapelvic hematoma after hysterectomy, measuring 10.4 by 12.8 x 11.7 cm. No adnexal mass. Other: Free intraperitoneal gas consistent with recent hysterectomy. Large intrapelvic hematoma as above. High attenuation free fluid within the bilateral flanks and left upper quadrant, consistent with hemoperitoneum after recent hysterectomy. No abdominal wall hernia. Musculoskeletal: No acute or destructive bony lesions. Reconstructed images demonstrate no additional findings. Review of the MIP images confirms the above findings. IMPRESSION: Chest: 1. No evidence of pulmonary embolus. 2. No acute intrathoracic process. Abdomen/pelvis: 1. Large intrapelvic hematoma after recent hysterectomy, with high attenuation fluid within the bilateral flanks and left upper quadrant consistent with postoperative hemoperitoneum. 2. Pneumoperitoneum consistent with recent surgical intervention. Critical Value/emergent results were called by telephone at the time of interpretation on 06/09/2022 at 11:14 pm to provider DR REES, who verbally acknowledged these results. Electronically Signed   By: Randa Ngo M.D.   On: 06/09/2022 23:16   DG Chest 2 View  Result Date: 06/09/2022 CLINICAL DATA:  Chest pain.  Hysterectomy 2 days ago EXAM: CHEST - 2 VIEW COMPARISON:  None Available. FINDINGS: Free intraperitoneal air under both hemidiaphragms. This is favored postoperative due to hysterectomy 2 days ago. The heart size and mediastinal contours are within normal limits. Both lungs are clear. Calcified left hilar nodes. The visualized skeletal structures are unremarkable. IMPRESSION: No active cardiopulmonary disease. Free air under the diaphragm favored postoperative due to hysterectomy 2 days  ago. Electronically Signed   By: Placido Sou M.D.   On: 06/09/2022 20:43    Assessment: Principal Problem:   Pelvic hematoma Active Problems:   Status post vaginal hysterectomy   Acute postoperative anemia due to greater than expected blood loss   Symptomatic anemia   Pelvic hematoma in female   Plan: Admit to Med-Surg No signs/symptoms of active bleeding Will transfuse with 3 pRBCs Keep NPO, consult IR in morning for possible drainage Analgesia as needed Routine floor care    Verita Schneiders, MD, [place], [facility] for [place]

## 2022-06-09 NOTE — ED Provider Notes (Signed)
Pt care assumed at 2300.  Pt s/p vag hys November 28 here with palpitations.  Labs significant for new anemia compared to preoperative lab with hemoglobin of 6.7, mildly elevated troponin.  Care assumed pending CTA PE, CTA/P  CTA is negative for PE.  Discussed with radiologist finding of CT abdomen pelvis with pelvic hematoma, no active extravasation.  Patient remains tachycardic to 120s, blood pressures between upper 90s to low 100s.  She is well-perfused and mentating well on examination.  She does have some anxiety as well.  Discussed with Dr.Anyanwu with OB/GYN-recommends transfer to [facility] with ultimate plan to admit but there are currently no inpatient beds available.  Will transfer ED to ED so patient can be assessed by OB/GYN in the emergency department as well as have blood transfusion initiated.  Discussed with Dr. Christy Gentles in the [facility] emergency department who accepts the patient in transfer.   Repeat troponin is essentially stable when compared to prior, patient without chest pain-suspect elevated troponins are secondary to patient's underlying tachycardia and anemia, doubt ACS.   CRITICAL CARE Performed by: Quintella Reichert   Total critical care time: 35 minutes  Critical care time was exclusive of separately billable procedures and treating other patients.  Critical care was necessary to treat or prevent imminent or life-threatening deterioration.  Critical care was time spent personally by me on the following activities: development of treatment plan with patient and/or surrogate as well as nursing, discussions with consultants, evaluation of patient's response to treatment, examination of patient, obtaining history from patient or surrogate, ordering and performing treatments and interventions, ordering and review of laboratory studies, ordering and review of radiographic studies, pulse oximetry and re-evaluation of patient's condition.    Quintella Reichert, MD 06/10/22  [phone]

## 2022-06-09 NOTE — ED Provider Notes (Signed)
Emergency Department Provider Note   I have reviewed the triage vital signs and the nursing notes.   HISTORY  Chief Complaint Palpitations   HPI Madison Morris is a 36 y.o. female with PMH of asthma, GERD, and symptomatic uterine fibroids with menorrhagia 2 days s/p total vaginal hysterectomy (Dr. Damita Dunnings) presents to the emergency department with heart palpitations.  She notes that her abdominal pain is improved significantly today but was initially severe.  She has not had significant vaginal bleeding.  Denies significant chest pain but does feel like her breath "stops short" with deep breathing. No fever or chills. She is urinating and passing flatus but has yet to have a BM. She noted palpitations yesterday but today they became more severe which prompted her ED visit.    Past Medical History:  Diagnosis Date   Allergy    Mold, dust mites, [place] grass, gluten   Anemia July 2014   I believe I don't have the problem anymore   Anginal pain ([place])    in teens   Anxiety    Arthritis March 2018   At least in my knees, noted after accident   Asthma    COVID 07/2020   Depression    GERD (gastroesophageal reflux disease)    H. pylori infection    IBS (irritable bowel syndrome)    Migraine    PONV (postoperative nausea and vomiting)     Review of Systems  Constitutional: No fever/chills Eyes: No visual changes. ENT: No sore throat. Cardiovascular: Denies chest pain. Positive palpitations.  Respiratory: Denies shortness of breath. Gastrointestinal: Positive post-op abdominal pain (improving).  No nausea, no vomiting.  No diarrhea.  Genitourinary: Negative for dysuria. Musculoskeletal: Negative for back pain. Skin: Negative for rash. Neurological: Negative for headaches, focal weakness or numbness.  ____________________________________________   PHYSICAL EXAM:  VITAL SIGNS: ED Triage Vitals  Enc Vitals Group     BP 06/09/22 1956 120/72     Pulse Rate 06/09/22 1956  (!) 140     Resp 06/09/22 1956 15     Temp 06/09/22 1956 98.1 F (36.7 C)     Temp Source 06/09/22 1956 Oral     SpO2 06/09/22 1956 99 %     Weight 06/09/22 1958 138 lb 0.1 oz (62.6 kg)     Height 06/09/22 1958 '5\' 6"'$  (1.676 m)   Constitutional: Alert and oriented. Well appearing and in no acute distress. Eyes: Conjunctivae are normal.  Head: Atraumatic. Nose: No congestion/rhinnorhea. Mouth/Throat: Mucous membranes are moist.  Neck: No stridor. Cardiovascular: Tachycardia. Good peripheral circulation. Grossly normal heart sounds.   Respiratory: Normal respiratory effort.  No retractions. Lungs CTAB. Gastrointestinal: Soft with mild diffuse tenderness. No peritonitis. No distention.  Musculoskeletal: No lower extremity tenderness nor edema. No gross deformities of extremities. Neurologic:  Normal speech and language.  Skin:  Skin is warm, dry and intact. No rash noted.  ____________________________________________   LABS (all labs ordered are listed, but only abnormal results are displayed)  Labs Reviewed  COMPREHENSIVE METABOLIC PANEL - Abnormal; Notable for the following components:      Result Value   Glucose, Bld 117 (*)    Calcium 8.5 (*)    Alkaline Phosphatase 30 (*)    All other components within normal limits  LIPASE, BLOOD - Abnormal; Notable for the following components:   Lipase 59 (*)    All other components within normal limits  CBC WITH DIFFERENTIAL/PLATELET - Abnormal; Notable for the following components:   RBC  2.21 (*)    Hemoglobin 6.7 (*)    HCT 20.0 (*)    All other components within normal limits  URINALYSIS, ROUTINE W REFLEX MICROSCOPIC - Abnormal; Notable for the following components:   APPearance HAZY (*)    Hgb urine dipstick TRACE (*)    Leukocytes,Ua TRACE (*)    All other components within normal limits  URINALYSIS, MICROSCOPIC (REFLEX) - Abnormal; Notable for the following components:   Bacteria, UA RARE (*)    All other components within  normal limits  TROPONIN I (HIGH SENSITIVITY) - Abnormal; Notable for the following components:   Troponin I (High Sensitivity) 67 (*)    All other components within normal limits  TROPONIN I (HIGH SENSITIVITY) - Abnormal; Notable for the following components:   Troponin I (High Sensitivity) 70 (*)    All other components within normal limits  URINE CULTURE  LACTIC ACID, PLASMA  LACTIC ACID, PLASMA  COMPREHENSIVE METABOLIC PANEL  PREPARE RBC (CROSSMATCH)  TYPE AND SCREEN   ____________________________________________  EKG   EKG Interpretation  Date/Time:  Thursday June 09 2022 20:01:26 EST Ventricular Rate:  138 PR Interval:  160 QRS Duration: 74 QT Interval:  256 QTC Calculation: 387 R Axis:   51 Text Interpretation: Sinus tachycardia T wave abnormality, consider inferior ischemia Abnormal ECG No previous ECGs available No old tracing for comparison Confirmed by Nanda Quinton [phone]) on 06/09/2022 8:09:26 PM        ____________________________________________  RADIOLOGY  CT Angio Chest PE W and/or Wo Contrast  Result Date: 06/09/2022 CLINICAL DATA:  Chest pain, tachycardia, headache, hysterectomy 2 days ago EXAM: CT ANGIOGRAPHY CHEST CT ABDOMEN AND PELVIS WITH CONTRAST TECHNIQUE: Multidetector CT imaging of the chest was performed using the standard protocol during bolus administration of intravenous contrast. Multiplanar CT image reconstructions and MIPs were obtained to evaluate the vascular anatomy. Multidetector CT imaging of the abdomen and pelvis was performed using the standard protocol during bolus administration of intravenous contrast. RADIATION DOSE REDUCTION: This exam was performed according to the departmental dose-optimization program which includes automated exposure control, adjustment of the mA and/or kV according to patient size and/or use of iterative reconstruction technique. CONTRAST:  126m OMNIPAQUE IOHEXOL 350 MG/ML SOLN COMPARISON:  06/09/2022  FINDINGS: CTA CHEST FINDINGS Cardiovascular: This is a technically adequate evaluation of the pulmonary vasculature. No filling defects or pulmonary emboli. The heart is unremarkable without pericardial effusion. No evidence of thoracic aortic aneurysm or dissection. Mediastinum/Nodes: No enlarged mediastinal, hilar, or axillary lymph nodes. Thyroid gland, trachea, and esophagus demonstrate no significant findings. Lungs/Pleura: No acute airspace disease, effusion, or pneumothorax. Minimal hypoventilatory changes are seen at the lung bases. Musculoskeletal: No acute or destructive bony lesions. Reconstructed images demonstrate no additional findings. Review of the MIP images confirms the above findings. CT ABDOMEN and PELVIS FINDINGS Hepatobiliary: No focal liver abnormality is seen. No gallstones, gallbladder wall thickening, or biliary dilatation. Pancreas: Unremarkable. No pancreatic ductal dilatation or surrounding inflammatory changes. Spleen: Normal in size without focal abnormality. Adrenals/Urinary Tract: The kidneys enhance normally. No urinary tract calculi or obstructive uropathy. The adrenals and bladder are unremarkable. Stomach/Bowel: No bowel obstruction or ileus. No bowel wall thickening or inflammatory change. Vascular/Lymphatic: No significant vascular findings. No pathologic adenopathy. Reproductive: There is a large postoperative intrapelvic hematoma after hysterectomy, measuring 10.4 by 12.8 x 11.7 cm. No adnexal mass. Other: Free intraperitoneal gas consistent with recent hysterectomy. Large intrapelvic hematoma as above. High attenuation free fluid within the bilateral flanks and left upper quadrant,  consistent with hemoperitoneum after recent hysterectomy. No abdominal wall hernia. Musculoskeletal: No acute or destructive bony lesions. Reconstructed images demonstrate no additional findings. Review of the MIP images confirms the above findings. IMPRESSION: Chest: 1. No evidence of pulmonary  embolus. 2. No acute intrathoracic process. Abdomen/pelvis: 1. Large intrapelvic hematoma after recent hysterectomy, with high attenuation fluid within the bilateral flanks and left upper quadrant consistent with postoperative hemoperitoneum. 2. Pneumoperitoneum consistent with recent surgical intervention. Critical Value/emergent results were called by telephone at the time of interpretation on 06/09/2022 at 11:14 pm to provider DR REES, who verbally acknowledged these results. Electronically Signed   By: Randa Ngo M.D.   On: 06/09/2022 23:16   CT ABDOMEN PELVIS W CONTRAST  Result Date: 06/09/2022 CLINICAL DATA:  Chest pain, tachycardia, headache, hysterectomy 2 days ago EXAM: CT ANGIOGRAPHY CHEST CT ABDOMEN AND PELVIS WITH CONTRAST TECHNIQUE: Multidetector CT imaging of the chest was performed using the standard protocol during bolus administration of intravenous contrast. Multiplanar CT image reconstructions and MIPs were obtained to evaluate the vascular anatomy. Multidetector CT imaging of the abdomen and pelvis was performed using the standard protocol during bolus administration of intravenous contrast. RADIATION DOSE REDUCTION: This exam was performed according to the departmental dose-optimization program which includes automated exposure control, adjustment of the mA and/or kV according to patient size and/or use of iterative reconstruction technique. CONTRAST:  164m OMNIPAQUE IOHEXOL 350 MG/ML SOLN COMPARISON:  06/09/2022 FINDINGS: CTA CHEST FINDINGS Cardiovascular: This is a technically adequate evaluation of the pulmonary vasculature. No filling defects or pulmonary emboli. The heart is unremarkable without pericardial effusion. No evidence of thoracic aortic aneurysm or dissection. Mediastinum/Nodes: No enlarged mediastinal, hilar, or axillary lymph nodes. Thyroid gland, trachea, and esophagus demonstrate no significant findings. Lungs/Pleura: No acute airspace disease, effusion, or  pneumothorax. Minimal hypoventilatory changes are seen at the lung bases. Musculoskeletal: No acute or destructive bony lesions. Reconstructed images demonstrate no additional findings. Review of the MIP images confirms the above findings. CT ABDOMEN and PELVIS FINDINGS Hepatobiliary: No focal liver abnormality is seen. No gallstones, gallbladder wall thickening, or biliary dilatation. Pancreas: Unremarkable. No pancreatic ductal dilatation or surrounding inflammatory changes. Spleen: Normal in size without focal abnormality. Adrenals/Urinary Tract: The kidneys enhance normally. No urinary tract calculi or obstructive uropathy. The adrenals and bladder are unremarkable. Stomach/Bowel: No bowel obstruction or ileus. No bowel wall thickening or inflammatory change. Vascular/Lymphatic: No significant vascular findings. No pathologic adenopathy. Reproductive: There is a large postoperative intrapelvic hematoma after hysterectomy, measuring 10.4 by 12.8 x 11.7 cm. No adnexal mass. Other: Free intraperitoneal gas consistent with recent hysterectomy. Large intrapelvic hematoma as above. High attenuation free fluid within the bilateral flanks and left upper quadrant, consistent with hemoperitoneum after recent hysterectomy. No abdominal wall hernia. Musculoskeletal: No acute or destructive bony lesions. Reconstructed images demonstrate no additional findings. Review of the MIP images confirms the above findings. IMPRESSION: Chest: 1. No evidence of pulmonary embolus. 2. No acute intrathoracic process. Abdomen/pelvis: 1. Large intrapelvic hematoma after recent hysterectomy, with high attenuation fluid within the bilateral flanks and left upper quadrant consistent with postoperative hemoperitoneum. 2. Pneumoperitoneum consistent with recent surgical intervention. Critical Value/emergent results were called by telephone at the time of interpretation on 06/09/2022 at 11:14 pm to provider DR REES, who verbally acknowledged these  results. Electronically Signed   By: MRanda NgoM.D.   On: 06/09/2022 23:16   DG Chest 2 View  Result Date: 06/09/2022 CLINICAL DATA:  Chest pain.  Hysterectomy 2 days ago EXAM:  CHEST - 2 VIEW COMPARISON:  None Available. FINDINGS: Free intraperitoneal air under both hemidiaphragms. This is favored postoperative due to hysterectomy 2 days ago. The heart size and mediastinal contours are within normal limits. Both lungs are clear. Calcified left hilar nodes. The visualized skeletal structures are unremarkable. IMPRESSION: No active cardiopulmonary disease. Free air under the diaphragm favored postoperative due to hysterectomy 2 days ago. Electronically Signed   By: Placido Sou M.D.   On: 06/09/2022 20:43    ____________________________________________   PROCEDURES  Procedure(s) performed:   Procedures  CRITICAL CARE Performed by: Margette Fast Total critical care time: 35 minutes Critical care time was exclusive of separately billable procedures and treating other patients. Critical care was necessary to treat or prevent imminent or life-threatening deterioration. Critical care was time spent personally by me on the following activities: development of treatment plan with patient and/or surrogate as well as nursing, discussions with consultants, evaluation of patient's response to treatment, examination of patient, obtaining history from patient or surrogate, ordering and performing treatments and interventions, ordering and review of laboratory studies, ordering and review of radiographic studies, pulse oximetry and re-evaluation of patient's condition.  Nanda Quinton, MD Emergency Medicine  ____________________________________________   INITIAL IMPRESSION / ASSESSMENT AND PLAN / ED COURSE  Pertinent labs & imaging results that were available during my care of the patient were reviewed by me and considered in my medical decision making (see chart for details).   This patient is  Presenting for Evaluation of palpitations, which does require a range of treatment options, and is a complaint that involves a high risk of morbidity and mortality.  The Differential Diagnoses include dehydration, PE, anemia, anxiety, etc.  Critical Interventions-    Medications  rizatriptan (MAXALT-MLT) disintegrating tablet 10 mg (has no administration in time range)  hyoscyamine (LEVSIN SL) SL tablet 0.125 mg (has no administration in time range)  clonazePAM (KLONOPIN) disintegrating tablet 0.5-2 mg (has no administration in time range)  tiZANidine (ZANAFLEX) tablet 2 mg (has no administration in time range)  albuterol (PROVENTIL) (2.5 MG/3ML) 0.083% nebulizer solution 3 mL (has no administration in time range)  fluticasone (FLONASE) 50 MCG/ACT nasal spray 1-2 spray (has no administration in time range)  loratadine (CLARITIN) tablet 10 mg (has no administration in time range)  lactated ringers infusion ( Intravenous New Bag/Given 06/10/22 0259)  HYDROmorphone (DILAUDID) injection 0.2-0.6 mg (has no administration in time range)  zolpidem (AMBIEN) tablet 5 mg (has no administration in time range)  docusate sodium (COLACE) capsule 100 mg (has no administration in time range)  magnesium hydroxide (MILK OF MAGNESIA) suspension 30 mL (has no administration in time range)  bisacodyl (DULCOLAX) EC tablet 5 mg (has no administration in time range)  magnesium citrate solution 1 Bottle (has no administration in time range)  alum & mag hydroxide-simeth (MAALOX/MYLANTA) 200-200-20 MG/5ML suspension 30 mL (has no administration in time range)  ondansetron (ZOFRAN) tablet 4 mg (has no administration in time range)    Or  ondansetron (ZOFRAN) injection 4 mg (has no administration in time range)  0.9 %  sodium chloride infusion (Manually program via Guardrails IV Fluids) (has no administration in time range)  diphenhydrAMINE (BENADRYL) capsule 25 mg (25 mg Oral Not Given 06/10/22 0257)  furosemide  (LASIX) injection 20 mg (20 mg Intravenous Not Given 06/10/22 0258)  LORazepam (ATIVAN) injection 0.5 mg ( Intravenous Canceled Entry 06/10/22 1000)  acetaminophen (TYLENOL) 160 MG/5ML solution 650 mg (has no administration in time range)  ibuprofen (ADVIL)  tablet 600 mg (has no administration in time range)  oxyCODONE (Oxy IR/ROXICODONE) immediate release tablet 5 mg (has no administration in time range)  sodium chloride 0.9 % bolus 1,000 mL (0 mLs Intravenous Stopped 06/09/22 2201)  acetaminophen (TYLENOL) 160 MG/5ML solution 650 mg (650 mg Oral Given 06/09/22 2235)  iohexol (OMNIPAQUE) 350 MG/ML injection 100 mL (100 mLs Intravenous Contrast Given 06/09/22 2300)  sodium chloride 0.9 % bolus 500 mL (500 mLs Intravenous New Bag/Given 06/09/22 2331)  LORazepam (ATIVAN) injection 0.5 mg (0.5 mg Intravenous Given 06/10/22 0033)  diphenhydrAMINE (BENADRYL) injection 25 mg (25 mg Intravenous Given 06/10/22 0610)    Reassessment after intervention: HR improved.    I did obtain Additional Historical Information from husband at bedside.  I decided to review pertinent External Data, and in summary patient with vaginal hysterectomy on 11/28 with 100 mL listed blood loss.   Clinical Laboratory Tests Ordered, included CBC shows hemoglobin of 6.7.  Prior value of 15. Troponin 67. No AKI.   Radiologic Tests Ordered, included CXR. I independently interpreted the images and agree with radiology interpretation.   Cardiac Monitor Tracing which shows sinus tachycardia.    Social Determinants of Health Risk patient is a non-smoker.   Consult complete with Gyn, Dr. Harolyn Rutherford. Plan for CTA and CT A/P and discussion after imaging.   Medical Decision Making: Summary:  Patient POD 2 with palpitations and tachycardia. Some soft symptoms of SOB but no severe symptoms, hypoxemia, or CP. Abdomen appears appropriately tender on my exam without frank peritonitis.   Reevaluation with update and discussion with patient.  Discussed Hb and need for blood transfusion ultimately. Do not see an indication for emergency release blood at this time. HR responding to IVF. No hypotension or AMS. Planning for CT and coordinate with Gyn. Care transferred to Dr. Ralene Bathe.   Disposition: pending  ____________________________________________  FINAL CLINICAL IMPRESSION(S) / ED DIAGNOSES  Final diagnoses:  Pelvic hematoma in female    Note:  This document was prepared using Dragon voice recognition software and may include unintentional dictation errors.  Nanda Quinton, MD, [facility] Emergency Medicine    Grizel Vesely, [facility], MD 06/10/22 [phone]

## 2022-06-09 NOTE — H&P (Signed)
Faculty Practice Gynecology Attending History and Physical  Madison Morris is a 36 y.o. [phone] POD#2 s/p TVH who is being admitted with postoperative hematoma, symptomatic anemia due to greater than expected blood loss during surgery.  Patient underwent TVH and was discharged same day as she was stable. She reported having severe abdominal pain after surgery, but this has improved.  However on 06/08/22, she started having palpitations and feeling like her breath "stops short" during deep breathing. The palpitations increased in severity, she felt she "could hear her heart beat in her ears".  She reported feeling worse because of her heart rate increasing and came to [facility] ED.  There, she was noted to be tachycardic to 140s, but rest of vitals were stable.  Benign abdominal exam, small amount of blood noted on pad.  Labs remarkable for hemoglobin of 6.7, was 15.0 on 05/24/22 (preop labs).  Also noted to have elevated Troponin I of 67>70. EKG showed sinus tachycardia with T wave abnormality, consider inferior ischemia.  Patient denied any chest pain, SOB or cardiac history; this was likely demand ischemia in the setting of severe anemia. Our service was consulted, my recommendation was to get CT of her cheat and abdomen/pelvic to evaluate for She was given IV fluids and sent for imaging: CT of chest was negative for PE but abdomen/pelvic CT showed a large postoperative intrapelvic hematoma after hysterectomy, measuring 10.4 by 12.8 x 11.7 cm. There was high attenuation free fluid within the bilateral flanks and left upper quadrant, consistent with hemoperitoneum after recent hysterectomy. The plan was admit to transfer her to [facility] for admission, transfusion and further management.  Upon my encounter with the patient, she reports still feeling like her heart is racing too fast. No CP, SOB.  No abdominal pain.  Denies any fevers, chills, sweats, dysuria, nausea, vomiting, other GI or GU symptoms or other general  symptoms.  Past Medical History:  Diagnosis Date   Allergy    Mold, dust mites, [place] grass, gluten   Anemia July 2014   I believe I don't have the problem anymore   Anginal pain ([place])    in teens   Anxiety    Arthritis March 2018   At least in my knees, noted after accident   Asthma    COVID 07/2020   Depression    GERD (gastroesophageal reflux disease)    H. pylori infection    IBS (irritable bowel syndrome)    Migraine    PONV (postoperative nausea and vomiting)    Past Surgical History:  Procedure Laterality Date   DILATION AND CURETTAGE OF Benjamin 2018   Repaired, still have issues with it today   umbilcal hernia     VAGINAL HYSTERECTOMY Bilateral 06/07/2022   Procedure: HYSTERECTOMY VAGINAL WITH SALPINGECTOMY;  Surgeon: Radene Gunning, MD;  Location: [place];  Service: Gynecology;  Laterality: Bilateral;   WISDOM TOOTH EXTRACTION     OB History  Gravida Para Term Preterm AB Living  '3 3 3     3  '$ SAB IAB Ectopic Multiple Live Births               # Outcome Date GA Lbr Len/2nd Weight Sex Delivery Anes PTL Lv  3 Term      Vag-Spont     2 Term      Vag-Spont     1 Term      Vag-Spont     Patient denies  any other pertinent gynecologic issues.  No current facility-administered medications on file prior to encounter.   Current Outpatient Medications on File Prior to Encounter  Medication Sig Dispense Refill   acetaminophen (TYLENOL) 500 MG tablet Take 1 tablet (500 mg total) by mouth every 8 (eight) hours as needed for mild pain. 30 tablet 0   albuterol (VENTOLIN HFA) 108 (90 Base) MCG/ACT inhaler Inhale 1-2 puffs into the lungs every 6 (six) hours as needed for shortness of breath or wheezing.     ascorbic acid (VITAMIN C) 500 MG tablet Take 500 mg by mouth daily at 12 noon.     bismuth subsalicylate (PEPTO BISMOL) 262 MG chewable tablet Chew 524 mg by mouth 3 (three) times daily as needed for diarrhea or loose stools or indigestion.      calcium carbonate (TUMS - DOSED IN MG ELEMENTAL CALCIUM) 500 MG chewable tablet Chew 2 tablets by mouth 2 (two) times daily as needed for indigestion or heartburn.     Camphor-Menthol-Methyl Sal (SALONPAS) 3.07-16-08 % PTCH Place 1 patch onto the skin daily as needed (pain.).     Cholecalciferol (VITAMIN D3) 50 MCG (2000 UT) TABS Take 4,000 Units by mouth daily at 12 noon.     clonazePAM (KLONOPIN) 1 MG disintegrating tablet Take 1 mg by mouth no more than 2-3 times weekly as needed for severe anxiety only. (Patient taking differently: Take 0.5-2 mg by mouth daily as needed (anxiety/vestibular migraines.). Take 1 mg by mouth no more than 2-3 times weekly as needed for severe anxiety only.) 15 tablet 2   CREAM BASE EX Apply 1 Application topically 3 (three) times daily as needed (pain). Migrastil Soothing Neck and Shoulder Cream     famotidine-calcium carbonate-magnesium hydroxide (PEPCID COMPLETE) 10-800-165 MG chewable tablet Chew 1 tablet by mouth daily as needed (indigestion/heartburn).     Ferrous Sulfate (IRON PO) Take 10 mLs by mouth See admin instructions. Take 2 teaspoonfuls (10 mls) by mouth up to twice daily     fluticasone (FLONASE) 50 MCG/ACT nasal spray Place 1-2 sprays into both nostrils daily as needed for allergies.     Homeopathic Products (HOMEOPATHIC CALM SL) Take 1 tablet by mouth at bedtime as needed (sleep).  CBD, L-Theanine, and 5-HTP     HOMEOPATHIC PRODUCTS PO Take 1 tablet by mouth at bedtime as needed (sleep). Magnesium, GABA Supplement, L-Theanine, Lemon Balm     hyoscyamine (LEVSIN SL) 0.125 MG SL tablet Place 0.125 mg under the tongue 2 (two) times daily as needed (abdominal pain/cramping).     ibuprofen (ADVIL) 800 MG tablet Take 1 tablet (800 mg total) by mouth 3 (three) times daily with meals as needed for headache, moderate pain or cramping. 60 tablet 0   Lactase (LACTAID PO) Take 1 tablet by mouth daily as needed (dairy consumption.).     lansoprazole (PREVACID) 30 MG  capsule Take 1 capsule (30 mg total) by mouth daily. (Patient taking differently: Take 30 mg by mouth at bedtime.) 90 capsule 1   Liniments (BLUE-EMU SUPER STRENGTH EX) Apply 4 Applications topically 3 (three) times daily as needed (pain).     loratadine (CLARITIN) 10 MG tablet Take 10 mg by mouth at bedtime.     Magnesium Oxide (MAG-OXIDE PO) Take 1 tablet by mouth at bedtime.     Menthol, Topical Analgesic, (BIOFREEZE EX) Apply 1 Application topically 3 (three) times daily as needed (pain.).     Multiple Vitamins-Minerals (CENTRUM MULTI + OMEGA 3 PO) Take 1 tablet by mouth  daily at 12 noon.     ondansetron (ZOFRAN-ODT) 8 MG disintegrating tablet Take 8 mg by mouth every 8 (eight) hours as needed for nausea or vomiting.     oxyCODONE (OXY IR/ROXICODONE) 5 MG immediate release tablet Take 1 tablet (5 mg total) by mouth every 4 (four) hours as needed for severe pain or breakthrough pain. 20 tablet 0   PAPAYA ENZYMES PO Take 1 capsule by mouth 2 (two) times daily as needed (digestive health/regularity). Daily Cleanse Digestive Enzymes     Peppermint Oil (IBGARD PO) Take 1 capsule by mouth daily as needed (digestive health.).     phenylephrine (SUDAFED PE) 10 MG TABS tablet Take 10 mg by mouth every 4 (four) hours as needed (headaches/migraine).     Polyethyl Glycol-Propyl Glycol (LUBRICANT EYE DROPS) 0.4-0.3 % SOLN Place 1 drop into both eyes 3 (three) times daily as needed (dry/irritated eyes.).     Probiotic Product (UP4 PROBIOTICS WOMENS PO) Take 1 capsule by mouth in the morning. Once Daily Women's Probiotic with 50 billion CFUs     pseudoephedrine (SUDAFED) 30 MG tablet Take 30 mg by mouth every 4 (four) hours as needed (headaches.).     rizatriptan (MAXALT-MLT) 10 MG disintegrating tablet Take 10 mg by mouth daily as needed for migraine.     simethicone (MYLICON) 80 MG chewable tablet Chew 80 mg by mouth See admin instructions. Take 1 tablet (80 mg) by mouth up to twice daily if needed for gas  issues     tiZANidine (ZANAFLEX) 2 MG tablet Take 2 mg by mouth daily as needed (migraines/vertigo).     Wheat Dextrin (BENEFIBER PO) Take 1 Dose by mouth daily.     Wheat Dextrin (BENEFIBER) CHEW Chew 1 tablet by mouth daily as needed (gut support/health).     Allergies  Allergen Reactions   Tretinoin Itching    Other reaction(s): Dermatitis Retin-A    Septra [Sulfamethoxazole-Trimethoprim] Nausea And Vomiting   Gluten Meal Nausea Only and Other (See Comments)    Stomach pain (gastrointestinal issues)/gas   Lactose Intolerance (Gi) Other (See Comments)    Gastrointestinal issues/gas (can tolerate some with lactaid)    Social History:   reports that she has never smoked. She has never used smokeless tobacco. She reports that she does not drink alcohol and does not use drugs. Family History  Problem Relation Age of Onset   Pulmonary Hypertension Mother    Early death Mother    Hypertension Mother    Asthma Father    Diabetes Maternal Grandmother    Hearing loss Maternal Grandmother    Cancer Paternal Grandfather    Anxiety disorder Daughter    Depression Brother     Review of Systems: Pertinent items noted in HPI and remainder of comprehensive ROS otherwise negative.  PHYSICAL EXAM: Blood pressure (!) 111/49, pulse (!) 116, temperature 99.1 F (37.3 C), temperature source Oral, resp. rate 19, height '5\' 6"'$  (1.676 m), weight 62.6 kg, last menstrual period 05/06/2022, SpO2 100 %. CONSTITUTIONAL: Well-developed, well-nourished female in no acute distress.  HENT:  Normocephalic, atraumatic, External right and left ear normal. Oropharynx is clear and moist EYES: Conjunctivae and EOM are normal. Pupils are equal, round, and reactive to light. No scleral icterus.  NECK: Normal range of motion, supple, no masses SKIN: Skin is warm and dry. No rash noted. Not diaphoretic. No erythema. No pallor. NEUROLOGIC: Alert and oriented to person, place, and time. Normal reflexes, muscle tone  coordination. No cranial nerve deficit noted. PSYCHIATRIC:  Normal mood and affect. Normal behavior. Normal judgment and thought content. CARDIOVASCULAR: Normal heart rate noted, regular rhythm RESPIRATORY: Effort and breath sounds normal, no problems with respiration noted ABDOMEN: Soft, nontender, nondistended. PELVIC: Scant blood on pad as per patient's report MUSCULOSKELETAL: Normal range of motion. No tenderness.  No cyanosis, clubbing, or edema.  2+ distal pulses.  Labs: Results for orders placed or performed during the hospital encounter of 06/09/22 (from the past 336 hour(s))  Comprehensive metabolic panel   Collection Time: 06/09/22  9:01 PM  Result Value Ref Range   Sodium 140 135 - 145 mmol/L   Potassium 4.1 3.5 - 5.1 mmol/L   Chloride 106 98 - 111 mmol/L   CO2 28 22 - 32 mmol/L   Glucose, Bld 117 (H) 70 - 99 mg/dL   BUN 17 6 - 20 mg/dL   Creatinine, Ser 0.69 0.44 - 1.00 mg/dL   Calcium 8.5 (L) 8.9 - 10.3 mg/dL   Total Protein 6.9 6.5 - 8.1 g/dL   Albumin 3.7 3.5 - 5.0 g/dL   AST 23 15 - 41 U/L   ALT 9 0 - 44 U/L   Alkaline Phosphatase 30 (L) 38 - 126 U/L   Total Bilirubin 0.5 0.3 - 1.2 mg/dL   GFR, Estimated >60 >60 mL/min   Anion gap 6 5 - 15  Lactic acid, plasma   Collection Time: 06/09/22  9:01 PM  Result Value Ref Range   Lactic Acid, Venous 1.5 0.5 - 1.9 mmol/L  Lipase, blood   Collection Time: 06/09/22  9:01 PM  Result Value Ref Range   Lipase 59 (H) 11 - 51 U/L  CBC with Differential   Collection Time: 06/09/22  9:01 PM  Result Value Ref Range   WBC 8.4 4.0 - 10.5 K/uL   RBC 2.21 (L) 3.87 - 5.11 MIL/uL   Hemoglobin 6.7 (LL) 12.0 - 15.0 g/dL   HCT 20.0 (L) 36.0 - 46.0 %   MCV 90.5 80.0 - 100.0 fL   MCH 30.3 26.0 - 34.0 pg   MCHC 33.5 30.0 - 36.0 g/dL   RDW 12.6 11.5 - 15.5 %   Platelets 183 150 - 400 K/uL   nRBC 0.0 0.0 - 0.2 %   Neutrophils Relative % 57 %   Neutro Abs 4.8 1.7 - 7.7 K/uL   Lymphocytes Relative 34 %   Lymphs Abs 2.9 0.7 - 4.0 K/uL    Monocytes Relative 8 %   Monocytes Absolute 0.7 0.1 - 1.0 K/uL   Eosinophils Relative 1 %   Eosinophils Absolute 0.1 0.0 - 0.5 K/uL   Basophils Relative 0 %   Basophils Absolute 0.0 0.0 - 0.1 K/uL   Immature Granulocytes 0 %   Abs Immature Granulocytes 0.02 0.00 - 0.07 K/uL  Troponin I (High Sensitivity)   Collection Time: 06/09/22  9:01 PM  Result Value Ref Range   Troponin I (High Sensitivity) 67 (H) <18 ng/L  Urinalysis, Routine w reflex microscopic Urine, Clean Catch   Collection Time: 06/09/22 10:28 PM  Result Value Ref Range   Color, Urine YELLOW YELLOW   APPearance HAZY (A) CLEAR   Specific Gravity, Urine 1.010 1.005 - 1.030   pH 6.5 5.0 - 8.0   Glucose, UA NEGATIVE NEGATIVE mg/dL   Hgb urine dipstick TRACE (A) NEGATIVE   Bilirubin Urine NEGATIVE NEGATIVE   Ketones, ur NEGATIVE NEGATIVE mg/dL   Protein, ur NEGATIVE NEGATIVE mg/dL   Nitrite NEGATIVE NEGATIVE   Leukocytes,Ua TRACE (A) NEGATIVE  Urinalysis,  Microscopic (reflex)   Collection Time: 06/09/22 10:28 PM  Result Value Ref Range   RBC / HPF 0-5 0 - 5 RBC/hpf   WBC, UA 0-5 0 - 5 WBC/hpf   Bacteria, UA RARE (A) NONE SEEN   Squamous Epithelial / LPF 0-5 0 - 5  Lactic acid, plasma   Collection Time: 06/09/22 11:07 PM  Result Value Ref Range   Lactic Acid, Venous 1.3 0.5 - 1.9 mmol/L  Troponin I (High Sensitivity)   Collection Time: 06/09/22 11:07 PM  Result Value Ref Range   Troponin I (High Sensitivity) 70 (H) <18 ng/L  Results for orders placed or performed during the hospital encounter of 06/07/22 (from the past 336 hour(s))  ABO/Rh   Collection Time: 06/07/22 12:12 PM  Result Value Ref Range   ABO/RH(D)      B POS Performed at [facility] [address] [place], [place] [address]   Pregnancy, urine POC   Collection Time: 06/07/22 12:31 PM  Result Value Ref Range   Preg Test, Ur NEGATIVE NEGATIVE  Surgical pathology   Collection Time: 06/07/22  1:03 PM  Result Value Ref Range   SURGICAL  PATHOLOGY      SURGICAL PATHOLOGY CASE: [id number] PATIENT: Ashlei Lineback Surgical Pathology Report     Clinical History: dysmenorrhea (cm)     FINAL MICROSCOPIC DIAGNOSIS:  A. UTERUS AND CERVIX, WITH BILATERAL FALLOPIAN TUBES, HYSTERECTOMY: - Cervix: Nabothian cyst - Endomyometrium: Benign proliferative endometrium, adenomyosis - Fallopian tubes: Benign, paratubal cyst   GROSS DESCRIPTION:  A.  Specimen: received fresh and subsequently placed in formalin labeled with the patient's name and "Uterus, cervix, bilateral fallopian tubes." Integrity: intact Uterus Size and Shape: 4.2 x 4.0 x 3.3 cm Weight: 70 g Serosa: pink-tan and glistening. Cervix: 3.0 cm long and 3.3 cm in diameter with pink, unremarkable ectocervical mucosa surrounding a 1.2 cm patent, ovoid external os. Endometrium: red-tan, pillowy endometrium lines the 2.4 cm long, 2.6 cm wide endometrial cavity. Myometrium: the 1.3-1.8 cm thick myometrium is grossly unremarkable. Detached undesignated fallopian  tubes: 8.4 x 0.3 cm with prominent fimbria, 6.2 x 0.3 cm with no discernible fimbria; red-tan with unremarkable cut and serosal surfaces. Block Summary: A1: anterior and posterior cervix A2: anterior uterus A3: posterior uterus A4: fimbriated FT and representative cross sections A5: second FT cross sections  (LEF 06/08/2022)   Final Diagnosis performed by Tilford Pillar, MD.   Electronically signed 06/09/2022 Technical component performed at [employer]. [facility], [place] [address] [place], [place] [address].  Professional component performed at [facility], [place] [address] [place], [place] [address].  Immunohistochemistry Technical component (if applicable) was performed at [facility]. [address], [place], [place], [place] [address].   IMMUNOHISTOCHEMISTRY DISCLAIMER (if applicable): Some of these immunohistochemical stains may have been developed  and the performance characteristics determine by [employer] [facility]. Some may not have been cleared or approved by the U.S. Food and Drug Administration. The FDA has determined that such clearance or approval is not necessary. This test is used for clinical purposes. It should not be regarded as investigational or for research. This laboratory is certified under the [place] (CLIA-88) as qualified to perform high complexity clinical laboratory testing.  The controls stained appropriately.     Imaging Studies: CT Angio Chest PE W and/or Wo Contrast  Result Date: 06/09/2022 CLINICAL DATA:  Chest pain, tachycardia, headache, hysterectomy 2 days ago EXAM: CT  ANGIOGRAPHY CHEST CT ABDOMEN AND PELVIS WITH CONTRAST TECHNIQUE: Multidetector CT imaging of the chest was performed using the standard protocol during bolus administration of intravenous contrast. Multiplanar CT image reconstructions and MIPs were obtained to evaluate the vascular anatomy. Multidetector CT imaging of the abdomen and pelvis was performed using the standard protocol during bolus administration of intravenous contrast. RADIATION DOSE REDUCTION: This exam was performed according to the departmental dose-optimization program which includes automated exposure control, adjustment of the mA and/or kV according to patient size and/or use of iterative reconstruction technique. CONTRAST:  188m OMNIPAQUE IOHEXOL 350 MG/ML SOLN COMPARISON:  06/09/2022 FINDINGS: CTA CHEST FINDINGS Cardiovascular: This is a technically adequate evaluation of the pulmonary vasculature. No filling defects or pulmonary emboli. The heart is unremarkable without pericardial effusion. No evidence of thoracic aortic aneurysm or dissection. Mediastinum/Nodes: No enlarged mediastinal, hilar, or axillary lymph nodes. Thyroid gland, trachea, and esophagus demonstrate no significant findings. Lungs/Pleura: No acute airspace  disease, effusion, or pneumothorax. Minimal hypoventilatory changes are seen at the lung bases. Musculoskeletal: No acute or destructive bony lesions. Reconstructed images demonstrate no additional findings. Review of the MIP images confirms the above findings. CT ABDOMEN and PELVIS FINDINGS Hepatobiliary: No focal liver abnormality is seen. No gallstones, gallbladder wall thickening, or biliary dilatation. Pancreas: Unremarkable. No pancreatic ductal dilatation or surrounding inflammatory changes. Spleen: Normal in size without focal abnormality. Adrenals/Urinary Tract: The kidneys enhance normally. No urinary tract calculi or obstructive uropathy. The adrenals and bladder are unremarkable. Stomach/Bowel: No bowel obstruction or ileus. No bowel wall thickening or inflammatory change. Vascular/Lymphatic: No significant vascular findings. No pathologic adenopathy. Reproductive: There is a large postoperative intrapelvic hematoma after hysterectomy, measuring 10.4 by 12.8 x 11.7 cm. No adnexal mass. Other: Free intraperitoneal gas consistent with recent hysterectomy. Large intrapelvic hematoma as above. High attenuation free fluid within the bilateral flanks and left upper quadrant, consistent with hemoperitoneum after recent hysterectomy. No abdominal wall hernia. Musculoskeletal: No acute or destructive bony lesions. Reconstructed images demonstrate no additional findings. Review of the MIP images confirms the above findings. IMPRESSION: Chest: 1. No evidence of pulmonary embolus. 2. No acute intrathoracic process. Abdomen/pelvis: 1. Large intrapelvic hematoma after recent hysterectomy, with high attenuation fluid within the bilateral flanks and left upper quadrant consistent with postoperative hemoperitoneum. 2. Pneumoperitoneum consistent with recent surgical intervention. Critical Value/emergent results were called by telephone at the time of interpretation on 06/09/2022 at 11:14 pm to provider DR REES, who  verbally acknowledged these results. Electronically Signed   By: MRanda NgoM.D.   On: 06/09/2022 23:16   CT ABDOMEN PELVIS W CONTRAST  Result Date: 06/09/2022 CLINICAL DATA:  Chest pain, tachycardia, headache, hysterectomy 2 days ago EXAM: CT ANGIOGRAPHY CHEST CT ABDOMEN AND PELVIS WITH CONTRAST TECHNIQUE: Multidetector CT imaging of the chest was performed using the standard protocol during bolus administration of intravenous contrast. Multiplanar CT image reconstructions and MIPs were obtained to evaluate the vascular anatomy. Multidetector CT imaging of the abdomen and pelvis was performed using the standard protocol during bolus administration of intravenous contrast. RADIATION DOSE REDUCTION: This exam was performed according to the departmental dose-optimization program which includes automated exposure control, adjustment of the mA and/or kV according to patient size and/or use of iterative reconstruction technique. CONTRAST:  1067mOMNIPAQUE IOHEXOL 350 MG/ML SOLN COMPARISON:  06/09/2022 FINDINGS: CTA CHEST FINDINGS Cardiovascular: This is a technically adequate evaluation of the pulmonary vasculature. No filling defects or pulmonary emboli. The heart is unremarkable without pericardial effusion. No evidence of thoracic aortic  aneurysm or dissection. Mediastinum/Nodes: No enlarged mediastinal, hilar, or axillary lymph nodes. Thyroid gland, trachea, and esophagus demonstrate no significant findings. Lungs/Pleura: No acute airspace disease, effusion, or pneumothorax. Minimal hypoventilatory changes are seen at the lung bases. Musculoskeletal: No acute or destructive bony lesions. Reconstructed images demonstrate no additional findings. Review of the MIP images confirms the above findings. CT ABDOMEN and PELVIS FINDINGS Hepatobiliary: No focal liver abnormality is seen. No gallstones, gallbladder wall thickening, or biliary dilatation. Pancreas: Unremarkable. No pancreatic ductal dilatation or  surrounding inflammatory changes. Spleen: Normal in size without focal abnormality. Adrenals/Urinary Tract: The kidneys enhance normally. No urinary tract calculi or obstructive uropathy. The adrenals and bladder are unremarkable. Stomach/Bowel: No bowel obstruction or ileus. No bowel wall thickening or inflammatory change. Vascular/Lymphatic: No significant vascular findings. No pathologic adenopathy. Reproductive: There is a large postoperative intrapelvic hematoma after hysterectomy, measuring 10.4 by 12.8 x 11.7 cm. No adnexal mass. Other: Free intraperitoneal gas consistent with recent hysterectomy. Large intrapelvic hematoma as above. High attenuation free fluid within the bilateral flanks and left upper quadrant, consistent with hemoperitoneum after recent hysterectomy. No abdominal wall hernia. Musculoskeletal: No acute or destructive bony lesions. Reconstructed images demonstrate no additional findings. Review of the MIP images confirms the above findings. IMPRESSION: Chest: 1. No evidence of pulmonary embolus. 2. No acute intrathoracic process. Abdomen/pelvis: 1. Large intrapelvic hematoma after recent hysterectomy, with high attenuation fluid within the bilateral flanks and left upper quadrant consistent with postoperative hemoperitoneum. 2. Pneumoperitoneum consistent with recent surgical intervention. Critical Value/emergent results were called by telephone at the time of interpretation on 06/09/2022 at 11:14 pm to provider DR REES, who verbally acknowledged these results. Electronically Signed   By: Randa Ngo M.D.   On: 06/09/2022 23:16   DG Chest 2 View  Result Date: 06/09/2022 CLINICAL DATA:  Chest pain.  Hysterectomy 2 days ago EXAM: CHEST - 2 VIEW COMPARISON:  None Available. FINDINGS: Free intraperitoneal air under both hemidiaphragms. This is favored postoperative due to hysterectomy 2 days ago. The heart size and mediastinal contours are within normal limits. Both lungs are clear.  Calcified left hilar nodes. The visualized skeletal structures are unremarkable. IMPRESSION: No active cardiopulmonary disease. Free air under the diaphragm favored postoperative due to hysterectomy 2 days ago. Electronically Signed   By: Placido Sou M.D.   On: 06/09/2022 20:43    Assessment: Principal Problem:   Pelvic hematoma Active Problems:   Status post vaginal hysterectomy   Acute postoperative anemia due to greater than expected blood loss   Symptomatic anemia   Pelvic hematoma in female   Plan: Admit to GYN No signs/symptoms of active bleeding Will transfuse with 3 pRBCs Keep NPO, consult IR in morning for possible drainage of pelvic hematoma SCDs for VTE prophylaxis Analgesia as needed Routine floor care    Verita Schneiders, MD, [place], [facility] for [employer], [place]

## 2022-06-10 DIAGNOSIS — Y92239 Unspecified place in hospital as the place of occurrence of the external cause: Secondary | ICD-10-CM | POA: Diagnosis not present

## 2022-06-10 DIAGNOSIS — Y838 Other surgical procedures as the cause of abnormal reaction of the patient, or of later complication, without mention of misadventure at the time of the procedure: Secondary | ICD-10-CM | POA: Diagnosis not present

## 2022-06-10 DIAGNOSIS — J45909 Unspecified asthma, uncomplicated: Secondary | ICD-10-CM | POA: Diagnosis not present

## 2022-06-10 DIAGNOSIS — D62 Acute posthemorrhagic anemia: Secondary | ICD-10-CM | POA: Diagnosis not present

## 2022-06-10 DIAGNOSIS — N9489 Other specified conditions associated with female genital organs and menstrual cycle: Secondary | ICD-10-CM | POA: Diagnosis not present

## 2022-06-10 DIAGNOSIS — F419 Anxiety disorder, unspecified: Secondary | ICD-10-CM | POA: Diagnosis not present

## 2022-06-10 DIAGNOSIS — Z825 Family history of asthma and other chronic lower respiratory diseases: Secondary | ICD-10-CM | POA: Diagnosis not present

## 2022-06-10 DIAGNOSIS — K219 Gastro-esophageal reflux disease without esophagitis: Secondary | ICD-10-CM | POA: Diagnosis not present

## 2022-06-10 DIAGNOSIS — Z8616 Personal history of COVID-19: Secondary | ICD-10-CM | POA: Diagnosis not present

## 2022-06-10 DIAGNOSIS — Z79899 Other long term (current) drug therapy: Secondary | ICD-10-CM | POA: Diagnosis not present

## 2022-06-10 DIAGNOSIS — Z833 Family history of diabetes mellitus: Secondary | ICD-10-CM | POA: Diagnosis not present

## 2022-06-10 DIAGNOSIS — E739 Lactose intolerance, unspecified: Secondary | ICD-10-CM | POA: Diagnosis not present

## 2022-06-10 DIAGNOSIS — Z888 Allergy status to other drugs, medicaments and biological substances status: Secondary | ICD-10-CM | POA: Diagnosis not present

## 2022-06-10 DIAGNOSIS — F32A Depression, unspecified: Secondary | ICD-10-CM | POA: Diagnosis not present

## 2022-06-10 DIAGNOSIS — R002 Palpitations: Secondary | ICD-10-CM | POA: Diagnosis not present

## 2022-06-10 DIAGNOSIS — N8003 Adenomyosis of the uterus: Secondary | ICD-10-CM | POA: Diagnosis not present

## 2022-06-10 DIAGNOSIS — N946 Dysmenorrhea, unspecified: Secondary | ICD-10-CM | POA: Diagnosis not present

## 2022-06-10 DIAGNOSIS — R000 Tachycardia, unspecified: Secondary | ICD-10-CM | POA: Diagnosis not present

## 2022-06-10 DIAGNOSIS — N99840 Postprocedural hematoma of a genitourinary system organ or structure following a genitourinary system procedure: Secondary | ICD-10-CM | POA: Diagnosis not present

## 2022-06-10 DIAGNOSIS — Z8249 Family history of ischemic heart disease and other diseases of the circulatory system: Secondary | ICD-10-CM | POA: Diagnosis not present

## 2022-06-10 LAB — CBC WITH DIFFERENTIAL/PLATELET
Abs Immature Granulocytes: 0.02 10*3/uL (ref 0.00–0.07)
Basophils Absolute: 0 10*3/uL (ref 0.0–0.1)
Basophils Relative: 0 %
Eosinophils Absolute: 0.1 10*3/uL (ref 0.0–0.5)
Eosinophils Relative: 1 %
HCT: 20 % — ABNORMAL LOW (ref 36.0–46.0)
Hemoglobin: 6.7 g/dL — CL (ref 12.0–15.0)
Immature Granulocytes: 0 %
Lymphocytes Relative: 34 %
Lymphs Abs: 2.9 10*3/uL (ref 0.7–4.0)
MCH: 30.3 pg (ref 26.0–34.0)
MCHC: 33.5 g/dL (ref 30.0–36.0)
MCV: 90.5 fL (ref 80.0–100.0)
Monocytes Absolute: 0.7 10*3/uL (ref 0.1–1.0)
Monocytes Relative: 8 %
Neutro Abs: 4.8 10*3/uL (ref 1.7–7.7)
Neutrophils Relative %: 57 %
Platelets: 183 10*3/uL (ref 150–400)
RBC: 2.21 MIL/uL — ABNORMAL LOW (ref 3.87–5.11)
RDW: 12.6 % (ref 11.5–15.5)
WBC: 8.4 10*3/uL (ref 4.0–10.5)
nRBC: 0 % (ref 0.0–0.2)

## 2022-06-10 LAB — COMPREHENSIVE METABOLIC PANEL
ALT: 9 U/L (ref 0–44)
AST: 15 U/L (ref 15–41)
Albumin: 3.1 g/dL — ABNORMAL LOW (ref 3.5–5.0)
Alkaline Phosphatase: 23 U/L — ABNORMAL LOW (ref 38–126)
Anion gap: 5 (ref 5–15)
BUN: 6 mg/dL (ref 6–20)
CO2: 25 mmol/L (ref 22–32)
Calcium: 8.5 mg/dL — ABNORMAL LOW (ref 8.9–10.3)
Chloride: 111 mmol/L (ref 98–111)
Creatinine, Ser: 0.66 mg/dL (ref 0.44–1.00)
GFR, Estimated: >60 mL/min (ref >60)
Glucose, Bld: 91 mg/dL (ref 70–99)
Potassium: 4 mmol/L (ref 3.5–5.1)
Sodium: 141 mmol/L (ref 135–145)
Total Bilirubin: 0.7 mg/dL (ref 0.3–1.2)
Total Protein: 5.8 g/dL — ABNORMAL LOW (ref 6.5–8.1)

## 2022-06-10 LAB — CBC
HCT: 24.7 % — ABNORMAL LOW (ref 36.0–46.0)
HCT: 29.4 % — ABNORMAL LOW (ref 36.0–46.0)
Hemoglobin: 8.7 g/dL — ABNORMAL LOW (ref 12.0–15.0)
Hemoglobin: 10.5 g/dL — ABNORMAL LOW (ref 12.0–15.0)
MCH: 31.2 pg (ref 26.0–34.0)
MCH: 30.9 pg (ref 26.0–34.0)
MCHC: 35.2 g/dL (ref 30.0–36.0)
MCHC: 35.7 g/dL (ref 30.0–36.0)
MCV: 88.5 fL (ref 80.0–100.0)
MCV: 86.5 fL (ref 80.0–100.0)
Platelets: 147 10*3/uL — ABNORMAL LOW (ref 150–400)
Platelets: 146 10*3/uL — ABNORMAL LOW (ref 150–400)
RBC: 2.79 MIL/uL — ABNORMAL LOW (ref 3.87–5.11)
RBC: 3.4 MIL/uL — ABNORMAL LOW (ref 3.87–5.11)
RDW: 13.4 % (ref 11.5–15.5)
RDW: 13.4 % (ref 11.5–15.5)
WBC: 6.4 10*3/uL (ref 4.0–10.5)
WBC: 7.1 10*3/uL (ref 4.0–10.5)
nRBC: 0 % (ref 0.0–0.2)
nRBC: 0 % (ref 0.0–0.2)

## 2022-06-10 LAB — PREPARE RBC (CROSSMATCH)

## 2022-06-10 MED ORDER — ORAL CARE MOUTH RINSE: 15.0000 mL | OROMUCOSAL | Status: DC | PRN

## 2022-06-10 MED ORDER — CLONAZEPAM 0.5 MG PO TABS: 1.0000 mg | ORAL_TABLET | Freq: Every day | ORAL | Status: DC | PRN

## 2022-06-10 MED ORDER — ACETAMINOPHEN 160 MG/5ML PO SOLN: 650.0000 mg | Freq: Four times a day (QID) | ORAL | Status: DC | PRN

## 2022-06-10 MED ORDER — IBUPROFEN 200 MG PO TABS: 600.0000 mg | ORAL_TABLET | Freq: Four times a day (QID) | ORAL | Status: DC | PRN

## 2022-06-10 MED ORDER — SUMATRIPTAN SUCCINATE 100 MG PO TABS: 100.0000 mg | ORAL_TABLET | ORAL | Status: DC | PRN

## 2022-06-10 MED ORDER — OXYCODONE HCL 5 MG PO TABS: 5.0000 mg | ORAL_TABLET | ORAL | Status: DC | PRN

## 2022-06-10 MED ORDER — DIPHENHYDRAMINE HCL 50 MG/ML IJ SOLN
25.0000 mg | Freq: Once | INTRAMUSCULAR | Status: AC
  Administered 2022-06-10: 25 mg via INTRAVENOUS
  Filled 2022-06-10: qty 1

## 2022-06-10 MED ORDER — LORAZEPAM 2 MG/ML IJ SOLN
0.5000 mg | INTRAMUSCULAR | Status: DC | PRN
  Administered 2022-06-10 (×3): 0.5 mg via INTRAVENOUS
  Filled 2022-06-10 (×3): qty 1

## 2022-06-10 NOTE — ED Notes (Addendum)
RN made aware patient blood READY AT THIS TIME.

## 2022-06-10 NOTE — ED Provider Notes (Signed)
D/w dr Harolyn Rutherford with GYN She will admit patient Patient updated plan.  She is resting comfortably   Madison Fraise, MD 06/10/22 [phone]

## 2022-06-10 NOTE — Progress Notes (Signed)
Consulted regarding Madison Morris who presented to ED overnight.  She is 3 days s/p TVH and has developed symptomatic anemia.  CT demonstrates large pelvic hematoma and hemoperitoneum.    IR not able to offer any intervention at this time.  As a general rule, it is not recommended to drain hematomas.  However, given the hemoperitoneum, should Madison Morris not respond to transfusion, a CTA with GI bleed protocol could be considered to evaluate for vessel needing embolization.  Information communicated to Dr. Elgie Congo at [phone].  IR remains available as needed.  Electronically Signed: Pasty Spillers, PA-C 06/10/2022, 9:29 AM

## 2022-06-10 NOTE — ED Notes (Addendum)
Pt transferred via [facility] from [facility] - Hx of hysterectomy on Tuesday - Hgb 6.7 - Intrapelvic hematoma - Pt tx here for blood transfusion  Vitals with EMS  Tachycardic d/t anxiety - Ativan given prior to transport  118/67 HR 117 20 RR  99% RA

## 2022-06-10 NOTE — ED Notes (Signed)
Pt ambulates to bathroom with minimal assistance.  Steady gait.

## 2022-06-10 NOTE — Progress Notes (Addendum)
POD3 s/p TVH, bilateral salpingectomy complicated by acute blood loss anemia and pelvic hematoma   Subjective: Patient reports nausea, tolerating PO, + flatus, and no problems voiding.  She is feeling better since getting blood.   Husband at bedside contributing to history.   Objective: Vitals:   06/10/22 1608 06/10/22 1650  BP: (!) 126/56 103/70  Pulse: 73 73  Resp: 16 16  Temp: 98.6 F (37 C) 98.6 F (37 C)  SpO2: 100%      General: alert, cooperative, and no distress Resp: normal effort Cardio: Normal rate GI: Not examined Extremities: extremities normal, atraumatic, no cyanosis or edema     Latest Ref Rng & Units 06/10/2022    2:25 PM 06/09/2022    9:01 PM 05/24/2022    8:44 AM  CBC  WBC 4.0 - 10.5 K/uL 6.4  8.4  6.8   Hemoglobin 12.0 - 15.0 g/dL 8.7  6.7  C 15.0   Hematocrit 36.0 - 46.0 % 24.7  20.0  45.0   Platelets 150 - 400 K/uL 147  183  265     C Corrected result     Assessment: POD3 s/p TVH, bilateral salpingectomy complicated by acute blood loss anemia and pelvic hematoma   Plan: Acute blood loss anemia - Now s/p 3 units and feeling well. Will d/c telemetry given stability and improvement with blood. Creatinien was nromal as well.  - Initial HgB after 2 units was appropriate at 8.7. Will recheck one at 2200 and then tomorrow morning. Reviewed if these are stable and she continues to feel well and meet routine postop goals (tolerating PO, ambulate, pain controlled and vitals stable, then she would be able to be discharged tomorrow with close follow up with me in the office.   Pelvic hematoma - IR does not recommend drainage and given that she has limited pain, I do not think any surgical intervention for it is warranted either.  - No dye extravasation on CT from yesterday which is reassuring. Would check CT Angio with GI protocol if hemoglobin does not rise and remain overall stable to confirm no continued bleeding.    3. Elevated troponin - CTA P/E  negative - Was stable on recheck in the ED and felt to be related to anemia.   Additionally, reviewed surgical pathology. Reviewed her PACU course. I will address as is appropriate.    LOS: 0 days    Radene Gunning, MD 06/10/2022, 7:45 PM

## 2022-06-10 NOTE — ED Notes (Signed)
Patient resting in bed with husband Josh at bedside. Patient requesting medication for nausea and vomiting. Medication will be retrieved after 15 min initial blood transfusion.

## 2022-06-11 DIAGNOSIS — N9489 Other specified conditions associated with female genital organs and menstrual cycle: Secondary | ICD-10-CM | POA: Diagnosis not present

## 2022-06-11 DIAGNOSIS — N946 Dysmenorrhea, unspecified: Secondary | ICD-10-CM

## 2022-06-11 LAB — CBC
HCT: 30.2 % — ABNORMAL LOW (ref 36.0–46.0)
Hemoglobin: 11 g/dL — ABNORMAL LOW (ref 12.0–15.0)
MCH: 31.5 pg (ref 26.0–34.0)
MCHC: 36.4 g/dL — ABNORMAL HIGH (ref 30.0–36.0)
MCV: 86.5 fL (ref 80.0–100.0)
Platelets: 160 10*3/uL (ref 150–400)
RBC: 3.49 MIL/uL — ABNORMAL LOW (ref 3.87–5.11)
RDW: 13.5 % (ref 11.5–15.5)
WBC: 6.8 10*3/uL (ref 4.0–10.5)
nRBC: 0 % (ref 0.0–0.2)

## 2022-06-11 LAB — BPAM RBC
Blood Product Expiration Date: 202312262359
Blood Product Expiration Date: 202312262359
Blood Product Expiration Date: 202312262359
ISSUE DATE / TIME: 202312010545
ISSUE DATE / TIME: 202312010916
ISSUE DATE / TIME: 202312011617
Unit Type and Rh: 7300
Unit Type and Rh: 7300
Unit Type and Rh: 7300

## 2022-06-11 LAB — TYPE AND SCREEN
ABO/RH(D): B POS
Antibody Screen: NEGATIVE
Unit division: 0
Unit division: 0
Unit division: 0

## 2022-06-11 MED ORDER — ACETAMINOPHEN 160 MG/5ML PO SOLN: 650.0000 mg | Freq: Four times a day (QID) | ORAL | 0 refills | Status: DC | PRN

## 2022-06-11 MED ORDER — IBUPROFEN 100 MG/5ML PO SUSP: 600.0000 mg | Freq: Four times a day (QID) | ORAL | 1 refills | Status: AC | PRN

## 2022-06-11 MED ORDER — ONDANSETRON 8 MG PO TBDP: 8.0000 mg | ORAL_TABLET | Freq: Three times a day (TID) | ORAL | 1 refills | Status: AC | PRN

## 2022-06-11 NOTE — Plan of Care (Signed)

## 2022-06-11 NOTE — Discharge Summary (Addendum)
Gynecology Physician Discharge Summary  Patient ID: Madison Morris MRN: [id number] DOB/AGE: [birth year] 38 y.o.  Admit Date: 06/09/2022 Discharge Date: 06/11/2022  Admission Diagnoses:  Pelvic hematoma in female [N94.89] Acute blood loss anemia  Discharge Diagnoses:  Same  Hospital Course:  Madison Morris is a 36 y.o. [id number] who presented to the ER at [facility] with heart pounding in her ear. She was diagnosed with anemia and had CT showing pelvic hematoma. She was otherwise stable outside of tachycardia. She was transferred to [facility] because no beds available and blood transfusion initiated. Once a bed was available she was moved to 6N. Her posttransfusion hgb rose appropriately to 10.5. The recheck this morning remained stable at 11.0.   By time of discharge, her pain was controlled on oral pain medications consisting of ibuprofen and tylenol; she was ambulating, voiding without difficulty, tolerating regular diet and passing flatus. She was deemed stable for discharge to home.   Significant Labs:    Latest Ref Rng & Units 06/11/2022    1:48 AM 06/10/2022   10:40 PM 06/10/2022    2:25 PM  CBC  WBC 4.0 - 10.5 K/uL 6.8  7.1  6.4   Hemoglobin 12.0 - 15.0 g/dL 11.0  10.5  8.7   Hematocrit 36.0 - 46.0 % 30.2  29.4  24.7   Platelets 150 - 400 K/uL 160  146  147     Discharge Exam: Blood pressure 107/64, pulse 80, temperature 99.1 F (37.3 C), temperature source Oral, resp. rate 16, height '5\' 6"'$  (1.676 m), weight 62.6 kg, last menstrual period 05/06/2022, SpO2 99 %. General appearance: alert and no distress  Resp: clear to auscultation bilaterally  Cardio: regular rate and rhythm  GI: soft, non-tender; bowel sounds normal; no masses, no organomegaly.  Incision: C/D/I, no erythema, no drainage noted Pelvic: scant blood on pad (done in presence of RN as chaperone)  Extremities: extremities normal, atraumatic, no cyanosis or edema and Homans sign is negative, no sign of DVT  Discharged Condition:  Stable  Disposition:  There are no questions and answers to display.         Allergies as of 06/11/2022       Reactions   Tretinoin Itching   Other reaction(s): Dermatitis Retin-A    Septra [sulfamethoxazole-trimethoprim] Nausea And Vomiting   Gluten Meal Nausea Only, Other (See Comments)   Stomach pain (gastrointestinal issues)/gas   Lactose Intolerance (gi) Other (See Comments)   Gastrointestinal issues/gas (can tolerate some with lactaid)        Medication List     STOP taking these medications    acetaminophen 500 MG tablet Commonly known as: TYLENOL Replaced by: acetaminophen 160 MG/5ML solution   ibuprofen 800 MG tablet Commonly known as: ADVIL Replaced by: ibuprofen 100 MG/5ML suspension   oxyCODONE 5 MG immediate release tablet Commonly known as: Oxy IR/ROXICODONE       TAKE these medications    acetaminophen 160 MG/5ML solution Commonly known as: TYLENOL Take 20.3 mLs (650 mg total) by mouth every 6 (six) hours as needed for mild pain, moderate pain or headache (prior to transfusion). Replaces: acetaminophen 500 MG tablet   albuterol 108 (90 Base) MCG/ACT inhaler Commonly known as: VENTOLIN HFA Inhale 1-2 puffs into the lungs every 6 (six) hours as needed for shortness of breath or wheezing.   ascorbic acid 500 MG tablet Commonly known as: VITAMIN C Take 500 mg by mouth daily at 12 noon.   BENEFIBER PO Take 1 Dose by mouth daily.  Benefiber Chew Chew 1 tablet by mouth daily as needed (gut support/health).   BIOFREEZE EX Apply 1 Application topically 3 (three) times daily as needed (pain.).   bismuth subsalicylate 166 MG chewable tablet Commonly known as: PEPTO BISMOL Chew 524 mg by mouth 3 (three) times daily as needed for diarrhea or loose stools or indigestion.   BLUE-EMU SUPER STRENGTH EX Apply 4 Applications topically 3 (three) times daily as needed (pain).   calcium carbonate 500 MG chewable tablet Commonly known as: TUMS -  dosed in mg elemental calcium Chew 2 tablets by mouth 2 (two) times daily as needed for indigestion or heartburn.   CENTRUM MULTI + OMEGA 3 PO Take 1 tablet by mouth daily at 12 noon.   clonazePAM 1 MG disintegrating tablet Commonly known as: KLONOPIN Take 1 mg by mouth no more than 2-3 times weekly as needed for severe anxiety only. What changed:  how much to take how to take this when to take this reasons to take this   CREAM BASE EX Apply 1 Application topically 3 (three) times daily as needed (pain). Migrastil Soothing Neck and Shoulder Cream   fluticasone 50 MCG/ACT nasal spray Commonly known as: FLONASE Place 1-2 sprays into both nostrils daily as needed for allergies.   HOMEOPATHIC CALM SL Take 1 tablet by mouth at bedtime as needed (sleep).  CBD, L-Theanine, and 5-HTP   HOMEOPATHIC PRODUCTS PO Take 1 tablet by mouth at bedtime as needed (sleep). Magnesium, GABA Supplement, L-Theanine, Lemon Balm   hyoscyamine 0.125 MG SL tablet Commonly known as: LEVSIN SL Place 0.125 mg under the tongue 2 (two) times daily as needed (abdominal pain/cramping).   IBGARD PO Take 1 capsule by mouth daily as needed (digestive health.).   ibuprofen 100 MG/5ML suspension Commonly known as: ADVIL Take 30 mLs (600 mg total) by mouth every 6 (six) hours as needed. Replaces: ibuprofen 800 MG tablet   IRON PO Take 10 mLs by mouth See admin instructions. Take 2 teaspoonfuls (10 mls) by mouth up to twice daily   LACTAID PO Take 1 tablet by mouth daily as needed (dairy consumption.).   lansoprazole 30 MG capsule Commonly known as: PREVACID Take 1 capsule (30 mg total) by mouth daily. What changed: when to take this   loratadine 10 MG tablet Commonly known as: CLARITIN Take 10 mg by mouth at bedtime.   Lubricant Eye Drops 0.4-0.3 % Soln Generic drug: Polyethyl Glycol-Propyl Glycol Place 1 drop into both eyes 3 (three) times daily as needed (dry/irritated eyes.).   MAG-OXIDE  PO Take 1 tablet by mouth at bedtime.   ondansetron 8 MG disintegrating tablet Commonly known as: ZOFRAN-ODT Take 1 tablet (8 mg total) by mouth every 8 (eight) hours as needed for nausea or vomiting.   PAPAYA ENZYMES PO Take 1 capsule by mouth 2 (two) times daily as needed (digestive health/regularity). Daily Cleanse Digestive Enzymes   Pepcid Complete 10-800-165 MG chewable tablet Generic drug: famotidine-calcium carbonate-magnesium hydroxide Chew 1 tablet by mouth daily as needed (indigestion/heartburn).   phenylephrine 10 MG Tabs tablet Commonly known as: SUDAFED PE Take 10 mg by mouth every 4 (four) hours as needed (headaches/migraine).   pseudoephedrine 30 MG tablet Commonly known as: SUDAFED Take 30 mg by mouth every 4 (four) hours as needed (headaches.).   rizatriptan 10 MG disintegrating tablet Commonly known as: MAXALT-MLT Take 10 mg by mouth daily as needed for migraine.   Salonpas 3.07-16-08 % Ptch Generic drug: Camphor-Menthol-Methyl Sal Place 1 patch onto the  skin daily as needed (pain.).   simethicone 80 MG chewable tablet Commonly known as: MYLICON Chew 80 mg by mouth See admin instructions. Take 1 tablet (80 mg) by mouth up to twice daily if needed for gas issues   tiZANidine 2 MG tablet Commonly known as: ZANAFLEX Take 2 mg by mouth daily as needed (migraines/vertigo).   UP4 PROBIOTICS WOMENS PO Take 1 capsule by mouth in the morning. Once Daily Women's Probiotic with 50 billion CFUs   Vitamin D3 50 MCG (2000 UT) Tabs Take 4,000 Units by mouth daily at 12 noon.       No future appointments.  Follow-up [place] for [facility] Follow up in 4 week(s).   Specialty: Obstetrics and Gynecology Contact information: [place], [place] [phone]                Total discharge time: 20 minutes   Signed:  Radene Gunning, MD, [place] Attending [place], [facility]

## 2022-06-11 NOTE — Progress Notes (Signed)
Patient given discharge instructions and verbalized understanding. PIV x2 removed. Patient dressed herself and all belongings collected. Husband at bedside to take home.

## 2022-06-12 ENCOUNTER — Other Ambulatory Visit: Payer: Self-pay | Admitting: Obstetrics and Gynecology

## 2022-06-12 DIAGNOSIS — N3000 Acute cystitis without hematuria: Secondary | ICD-10-CM

## 2022-06-12 LAB — URINE CULTURE: Culture: 10000 — AB

## 2022-06-12 MED ORDER — CIPROFLOXACIN HCL 500 MG PO TABS: 500.0000 mg | ORAL_TABLET | Freq: Two times a day (BID) | ORAL | 0 refills | Status: DC

## 2022-06-13 ENCOUNTER — Telehealth: Payer: Self-pay | Admitting: General Practice

## 2022-06-13 ENCOUNTER — Encounter: Payer: Self-pay | Admitting: Obstetrics and Gynecology

## 2022-06-13 LAB — URINE CULTURE
Culture: >=100000 — AB
Special Requests: NORMAL

## 2022-06-13 NOTE — Telephone Encounter (Signed)
Transition Care Management Follow-up Telephone Call Date of discharge and from where: 06/11/22 from [facility] How have you been since you were released from the hospital? Patient stated she has a new PCP. Will remove Joy as PCP. Any questions or concerns? No

## 2022-06-14 ENCOUNTER — Other Ambulatory Visit: Payer: Self-pay | Admitting: Obstetrics and Gynecology

## 2022-06-16 ENCOUNTER — Telehealth: Payer: Self-pay | Admitting: *Deleted

## 2022-06-16 NOTE — Telephone Encounter (Cosign Needed)
Pt called stating that she has been taking her pulse ox and noticed that when she gets up to move around her O2 level drops to 95-94 but goes back up to 99-100.  She is worried that something is wrong.  She denies any SOB or dizziness.  I assured her that it is normal but if the levels drop into the high 80's to let us know.  I encouraged her to ambulate as much as possible maybe when commercials come on TV to walk around the house.

## 2022-06-20 ENCOUNTER — Encounter: Payer: Self-pay | Admitting: Obstetrics and Gynecology

## 2022-06-23 ENCOUNTER — Other Ambulatory Visit (HOSPITAL_COMMUNITY)
Admission: RE | Admit: 2022-06-23 | Discharge: 2022-06-23 | Disposition: A | Discharge status: Home / Self Care | Payer: 59 | Source: Ambulatory Visit | Attending: Obstetrics and Gynecology | Admitting: Obstetrics and Gynecology

## 2022-06-23 ENCOUNTER — Encounter: Payer: Self-pay | Admitting: Obstetrics and Gynecology

## 2022-06-23 ENCOUNTER — Ambulatory Visit: Payer: 59 | Admitting: Obstetrics and Gynecology

## 2022-06-23 VITALS — BP 114/80 | HR 99 | Ht 66.0 in | Wt 135.0 lb

## 2022-06-23 DIAGNOSIS — N898 Other specified noninflammatory disorders of vagina: Secondary | ICD-10-CM | POA: Insufficient documentation

## 2022-06-23 DIAGNOSIS — G8918 Other acute postprocedural pain: Secondary | ICD-10-CM

## 2022-06-23 NOTE — Progress Notes (Signed)
   RETURN GYNECOLOGY VISIT  Subjective:  Rene Gonsoulin is a 36 y.o. G3P3 who is 2 weeks s/p TVH c/b postop pelvic hematoma presenting for evaluation of persistent pelvic pain.   Reports persistent lower abdominal/pelvic pain and feeling of pulling/pressure since surgery & readmission. Can be sharp/shooting. Exacerbated by movement & physical activity. Is alternating heat/ice and tried topical magnesium with ltd relief. Stopped taking tylenol, ibuprofen and oxycodone. Took abx for UTI and now noticing vulvar itching. Having difficulty sleeping. Tolerating PO, no n/v/f/c, VB.  Objective:   Vitals:   06/23/22 0934  BP: 114/80  Pulse: 99  Weight: 135 lb (61.2 kg)  Height: '5\' 6"'$  (1.676 m)   General:  Alert, oriented and cooperative. Patient is in no acute distress.  Skin: Skin is warm and dry. No rash noted.   Cardiovascular: Normal heart rate noted  Respiratory: Normal respiratory effort, no problems with respiration noted  Abdomen: Soft, superficial tenderness L>R. No rebound/guarding    Pelvic:  Vulva w/ small area of erythema and excoriation around introitus. Thin white/yellow discharge. Cuff visually and palpably intact with no erythema, warmth or tenderness; no bleeding or purulent drainage. Left obturator tenderness.   Assessment and Plan:  Jayleigh Notarianni is a 36 y.o. with postoperative pelvic pain  Post-operative pain - Cuff visually and palpably intact with no evidence of cellulitis or abscess on exam. - Suspect component of MSK/pelvic floor spasm, expected postoperative pain with no oral analgesics, and resorbing 12cm post operative pelvic hematoma. - Encouraged topical analgesics on abdomen - Reviewed pros/cons of ibuprofen & oxycodone, she would like to avoid these medications for now.  - Has prescription for muscle relaxer (tizanidine) that she will try - Discussed potential role for PFPT if symptoms persist over the next several weeks. Reviewed she will need to be more healed at  the cuff before we would start PFPT.  2. Vaginal irritation - No e/o cuff abscess/cellulitis as above - Discussed external coconut oil or emollients if swab negative for candida -     Cervicovaginal ancillary only( [place])  Follow up with Dr. Damita Dunnings as scheduled on 1/4. Pt will contact us if her symptoms worsen or fail to improve prior to her next appointment.   Future Appointments  Date Time Provider [place]  07/14/2022  1:30 PM Radene Gunning, MD [facility]   Inez Catalina, MD

## 2022-06-24 LAB — CERVICOVAGINAL ANCILLARY ONLY
Bacterial Vaginitis (gardnerella): NEGATIVE
Candida Glabrata: NEGATIVE
Candida Vaginitis: POSITIVE — AB
Comment: NEGATIVE
Comment: NEGATIVE
Comment: NEGATIVE
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
## 2022-06-24 MED ORDER — FLUCONAZOLE 150 MG PO TABS: 150.0000 mg | ORAL_TABLET | ORAL | 0 refills | Status: DC

## 2022-06-24 NOTE — Progress Notes (Signed)
Results c/w vulvovaginal candidiasis. Diflucan sent to patients pharmacy.   Gale Journey, MD [place], [facility] for [employer], [place]

## 2022-06-24 NOTE — Addendum Note (Signed)
Addended by: Gale Journey on: 06/24/2022 12:06 PM   Modules accepted: Orders

## 2022-07-14 ENCOUNTER — Encounter: Payer: Self-pay | Admitting: Obstetrics and Gynecology

## 2022-07-14 ENCOUNTER — Ambulatory Visit (INDEPENDENT_AMBULATORY_CARE_PROVIDER_SITE_OTHER): Payer: 59 | Admitting: Obstetrics and Gynecology

## 2022-07-14 VITALS — BP 123/83 | HR 101 | Wt 137.0 lb

## 2022-07-14 DIAGNOSIS — N9489 Other specified conditions associated with female genital organs and menstrual cycle: Secondary | ICD-10-CM

## 2022-07-14 DIAGNOSIS — K5900 Constipation, unspecified: Secondary | ICD-10-CM | POA: Insufficient documentation

## 2022-07-14 DIAGNOSIS — Z90710 Acquired absence of both cervix and uterus: Secondary | ICD-10-CM

## 2022-07-14 DIAGNOSIS — Z09 Encounter for follow-up examination after completed treatment for conditions other than malignant neoplasm: Secondary | ICD-10-CM

## 2022-07-14 HISTORY — DX: Constipation, unspecified: K59.00

## 2022-07-14 NOTE — Progress Notes (Signed)
Amb ref pt   GYNECOLOGY OFFICE VISIT NOTE  History:   Madison Morris is a 37 y.o. [phone] here today for postop check. She is s/p TVH on 73/22 complicated by pelvic hematoma and received a postop transfusion on POD2 when she presented back with symptoms of anemia. It was also complicated by UTI on 02/5 which showed enterobacter for which she took antibiotics. She was seen in the office on 12/14 for pelvic pain and vulvovaginitis. She was diagnosed and treated for a yeast infection. She and Dr. Mikel Cella at that time discussed possible PFPT if still having postop pain that is not improving and discussed option for relief at that time as well.   Since that time, she notes she has improved in her physical recovery but also has some abdominal pain that is sharp and shooting, normally down into her legs. It is brief and sporadic when it comes. She does not need to take anything for it because of how brief it is. She does feel like she is able to walk less distance than she did prior to her surgery and sometimes has events happen that trigger a PTSD type response that reminds her of how she felt after her surgery. She is working on finding a therapist who does more trauma type response work but hasn't yet established with them.   Sometimes she does still feel angry about the circumstances around her complication. She felt rushed out from the hospital nurses. I informed her I did reach out the the PACU head nurse who looked into everything and we have a plan for addressing it. She did report her complaint to the online hospital system regarding the nurses. She informed me she doesn't feel angry with me (although we discussed that she can share any of these types of feelings with me if at any point she is).    Past Medical History:  Diagnosis Date   Allergy    Mold, dust mites, [place] grass, gluten   Anemia July 2014   I believe I don't have the problem anymore   Anginal pain ([place])    in teens   Anxiety     Arthritis March 2018   At least in my knees, noted after accident   Asthma    COVID 07/2020   Depression    GERD (gastroesophageal reflux disease)    H. pylori infection    IBS (irritable bowel syndrome)    Migraine    PONV (postoperative nausea and vomiting)     Past Surgical History:  Procedure Laterality Date   DILATION AND CURETTAGE OF [place] 2018   Repaired, still have issues with it today   umbilcal hernia     VAGINAL HYSTERECTOMY Bilateral 06/07/2022   Procedure: HYSTERECTOMY VAGINAL WITH SALPINGECTOMY;  Surgeon: Radene Gunning, MD;  Location: [place];  Service: Gynecology;  Laterality: Bilateral;   WISDOM TOOTH EXTRACTION      The following portions of the patient's history were reviewed and updated as appropriate: allergies, current medications, past family history, past medical history, past social history, past surgical history and problem list.   Health Maintenance:   Normal pap and negative HRHPV on 09/24/2021.   Diagnosis  Date Value Ref Range Status  09/24/2021   Final   - Negative for intraepithelial lesion or malignancy (NILM)   Review of Systems:  Pertinent items noted in HPI and remainder of comprehensive ROS otherwise negative.  Physical Exam:  BP 123/83  Pulse (!) 101   Wt 137 lb (62.1 kg)   LMP 05/06/2022   BMI 22.11 kg/m  CONSTITUTIONAL: Well-developed, well-nourished female in no acute distress.  HEENT:  Normocephalic, atraumatic. External right and left ear normal. No scleral icterus.  NECK: Normal range of motion, supple, no masses noted on observation SKIN: No rash noted. Not diaphoretic. No erythema. No pallor. MUSCULOSKELETAL: Normal range of motion. No edema noted. NEUROLOGIC: Alert and oriented to person, place, and time. Normal muscle tone coordination. No cranial nerve deficit noted. PSYCHIATRIC: Normal mood and affect. Normal behavior. Normal judgment and thought content.  CARDIOVASCULAR: Normal heart rate  noted RESPIRATORY: Effort and breath sounds normal, no problems with respiration noted ABDOMEN: No masses noted. No other overt distention noted.    PELVIC: Normal appearing external genitalia; normal urethral meatus; normal appearing vaginal mucosa. Cuff inspected and palpated and is healing well. Some bilateral levator and obturator tenderness and bladder is tender. Cuff is nontender. . Performed in the presence of a chaperone  Labs and Imaging No results found for this or any previous visit (from the past 168 hour(s)). No results found.  Assessment and Plan:   1. Postop check All restrictions lifted except for nothing in the vagina until 12 weeks postop. May do tub baths Discussed PFPT at 8 weeks - If [facility] doesn't have option closer to her than we can look into something more in this area. They can help both with urinary symptoms but also with pelvic floor muscle tenderness.  Check urine culture to ensure bladder infection is gone Discussed meeting with Roselyn Reef with therapy since can be done virtual. I think it is better to address this sooner than later since still symptoms of anxiety and to help prevent full PTSD.  -     Ambulatory referral to Physical Therapy -     Amb ref to [facility]  2. Status post vaginal hysterectomy Paps no longer indicated  -     Urine Culture -     Ambulatory referral to Physical Therapy -     Amb ref to [place]  3. Pelvic hematoma Last hemoglobin was 11.0 on 12/2.  Has had more CBCs from PCP - she will send me a picture of results. She states PCP has instructed her to stop any iron.  -     Ambulatory referral to Physical Therapy -     Amb ref to [place]    Routine preventative health maintenance measures emphasized. Please refer to After Visit Summary for other counseling recommendations.   Return in about 6 weeks (around 08/25/2022) for post-op check up.  Radene Gunning, MD,  [place] for [facility], [place]

## 2022-07-16 LAB — SPECIMEN STATUS REPORT

## 2022-07-16 LAB — URINE CULTURE: Organism ID, Bacteria: NO GROWTH

## 2022-07-20 ENCOUNTER — Encounter: Payer: Self-pay | Admitting: Obstetrics and Gynecology

## 2022-07-22 ENCOUNTER — Other Ambulatory Visit: Payer: Self-pay | Admitting: *Deleted

## 2022-07-22 DIAGNOSIS — R102 Pelvic and perineal pain: Secondary | ICD-10-CM

## 2022-07-22 NOTE — Progress Notes (Signed)
Pt floor PT referral placed for [facility] per Dr Damita Dunnings.  Pt was originally referred to [facility] PT but is already established with [facility] and prefer to go there.

## 2022-08-12 DIAGNOSIS — G4486 Cervicogenic headache: Secondary | ICD-10-CM | POA: Diagnosis not present

## 2022-08-12 DIAGNOSIS — G43809 Other migraine, not intractable, without status migrainosus: Secondary | ICD-10-CM | POA: Diagnosis not present

## 2022-08-12 DIAGNOSIS — Z8616 Personal history of COVID-19: Secondary | ICD-10-CM | POA: Diagnosis not present

## 2022-08-12 DIAGNOSIS — G43709 Chronic migraine without aura, not intractable, without status migrainosus: Secondary | ICD-10-CM | POA: Diagnosis not present

## 2022-08-12 DIAGNOSIS — Z9889 Other specified postprocedural states: Secondary | ICD-10-CM | POA: Diagnosis not present

## 2022-08-12 DIAGNOSIS — F418 Other specified anxiety disorders: Secondary | ICD-10-CM | POA: Diagnosis not present

## 2022-08-12 DIAGNOSIS — R5383 Other fatigue: Secondary | ICD-10-CM | POA: Diagnosis not present

## 2022-08-15 ENCOUNTER — Ambulatory Visit (INDEPENDENT_AMBULATORY_CARE_PROVIDER_SITE_OTHER): Payer: 59 | Admitting: Licensed Clinical Social Worker

## 2022-08-15 DIAGNOSIS — F419 Anxiety disorder, unspecified: Secondary | ICD-10-CM | POA: Diagnosis not present

## 2022-08-16 NOTE — BH Specialist Note (Signed)
Integrated Behavioral Health via Telemedicine Visit  08/16/2022 Madison Morris [id number]  Number of [place] Clinician visits: 1 Session Start time:  9:09am Session End time: 9:47am Total time in minutes: 38 mins via mychart video    Referring Provider: Dr. Damita Dunnings  Patient/Family location: Home  [facility] Provider location: [place]  All persons participating in visit: Pt Madison Morris and LCSW A Madison Morris  Types of Service: Individual psychotherapy and Video visit  I connected with Madison Morris and/or Madison Morris's n/a via  Telephone or Geologist, engineering  (Video is Caregility application) and verified that I am speaking with the correct person using two identifiers. Discussed confidentiality: Yes   I discussed the limitations of telemedicine and the availability of in person appointments.  Discussed there is a possibility of technology failure and discussed alternative modes of communication if that failure occurs.  I discussed that engaging in this telemedicine visit, they consent to the provision of behavioral healthcare and the services will be billed under their insurance.  Patient and/or legal guardian expressed understanding and consented to Telemedicine visit: Yes   Presenting Concerns: Patient and/or family reports the following symptoms/concerns: General anxiety disorder Duration of problem: 18 month +; Severity of problem: mild  Patient and/or Family's Strengths/Protective Factors: Concrete supports in place (healthy food, safe environments, etc.)  Goals Addressed: Patient will:  Reduce symptoms of: anxiety   Increase knowledge and/or ability of: coping skills   Demonstrate ability to: Increase healthy adjustment to current life circumstances  Progress towards Goals: Achieved Ms. Sargent will continue therapy services with current provider  Interventions: Interventions utilized:  Motivational Interviewing and Supportive  Counseling Standardized Assessments completed: n/a  Patient and/or Family Response:   Assessment: Patient currently experiencing general anxiety disorder.   Patient may benefit from therapy with current bh provider.  Plan: Follow up with behavioral health clinician on : as needed Behavioral recommendations: Journal writing to help the documenting trigger and effective copings skills, family therapy with children and engage in self care  Referral(s): [facility] (LME/Outside Clinic)  I discussed the assessment and treatment plan with the patient and/or parent/guardian. They were provided an opportunity to ask questions and all were answered. They agreed with the plan and demonstrated an understanding of the instructions.   They were advised to call back or seek an in-person evaluation if the symptoms worsen or if the condition fails to improve as anticipated.  Lynnea Ferrier, LCSW

## 2022-08-20 NOTE — Progress Notes (Unsigned)
   GYNECOLOGY OFFICE VISIT NOTE  History:   Madison Morris is a 37 y.o. [phone] here today for postop check. She is s/p TVH on 45/40 complicated by pelvic hematoma and received a postop transfusion on POD2 when she presented back with symptoms of anemia. It was also complicated by UTI on 98/1 which showed enterobacter for which she took antibiotics. She was seen in the office on 12/14 for pelvic pain and vulvovaginitis. She was diagnosed and treated for a yeast infection.   She has started PFPT with [facility].    Review of Systems:  Pertinent items noted in HPI and remainder of comprehensive ROS otherwise negative.  Physical Exam:  LMP 05/06/2022  CONSTITUTIONAL: Well-developed, well-nourished female in no acute distress.  HEENT:  Normocephalic, atraumatic. External right and left ear normal. No scleral icterus.  NECK: Normal range of motion, supple, no masses noted on observation SKIN: No rash noted. Not diaphoretic. No erythema. No pallor. MUSCULOSKELETAL: Normal range of motion. No edema noted. NEUROLOGIC: Alert and oriented to person, place, and time. Normal muscle tone coordination. No cranial nerve deficit noted. PSYCHIATRIC: Normal mood and affect. Normal behavior. Normal judgment and thought content.  CARDIOVASCULAR: Normal heart rate noted RESPIRATORY: Effort and breath sounds normal, no problems with respiration noted ABDOMEN: No masses noted. No other overt distention noted.    PELVIC: {Blank single:19197::"Deferred","Normal appearing external genitalia; normal urethral meatus; normal appearing vaginal mucosa and cervix.  No abnormal discharge noted.  Normal uterine size, no other palpable masses, no uterine or adnexal tenderness. Performed in the presence of a chaperone"}  Labs and Imaging No results found for this or any previous visit (from the past 168 hour(s)). No results found.  Assessment and Plan:   1. Status post vaginal hysterectomy She is now 8 weeks out from surgery.    2. Pelvic hematoma in female Would anticipate resolution by this point. No need to follow up with imaging.   3. Postop check Continue with pelvic restrictions with intercourse until the end of this month- she may resume intercourse in March if she feels ready. Would complete course of therapy with PT first if possible.     Diagnoses and all orders for this visit:  Status post vaginal hysterectomy  Pelvic hematoma in female  Postop check    Routine preventative health maintenance measures emphasized. Please refer to After Visit Summary for other counseling recommendations.   No follow-ups on file.  Radene Gunning, MD, [place] [facility], [place]

## 2022-08-24 ENCOUNTER — Ambulatory Visit (INDEPENDENT_AMBULATORY_CARE_PROVIDER_SITE_OTHER): Payer: 59 | Admitting: Obstetrics and Gynecology

## 2022-08-24 ENCOUNTER — Ambulatory Visit: Payer: 59 | Attending: Cardiology | Admitting: Cardiology

## 2022-08-24 ENCOUNTER — Encounter: Payer: Self-pay | Admitting: Cardiology

## 2022-08-24 ENCOUNTER — Encounter: Payer: Self-pay | Admitting: Obstetrics and Gynecology

## 2022-08-24 ENCOUNTER — Telehealth: Payer: Self-pay | Admitting: Cardiology

## 2022-08-24 VITALS — BP 112/74 | HR 79 | Ht 66.0 in | Wt 139.0 lb

## 2022-08-24 VITALS — BP 99/70 | HR 99 | Ht 66.0 in | Wt 139.1 lb

## 2022-08-24 DIAGNOSIS — R002 Palpitations: Secondary | ICD-10-CM | POA: Diagnosis not present

## 2022-08-24 DIAGNOSIS — R000 Tachycardia, unspecified: Secondary | ICD-10-CM

## 2022-08-24 DIAGNOSIS — A048 Other specified bacterial intestinal infections: Secondary | ICD-10-CM | POA: Insufficient documentation

## 2022-08-24 DIAGNOSIS — T7840XA Allergy, unspecified, initial encounter: Secondary | ICD-10-CM | POA: Insufficient documentation

## 2022-08-24 DIAGNOSIS — D649 Anemia, unspecified: Secondary | ICD-10-CM | POA: Insufficient documentation

## 2022-08-24 DIAGNOSIS — M1990 Unspecified osteoarthritis, unspecified site: Secondary | ICD-10-CM | POA: Insufficient documentation

## 2022-08-24 DIAGNOSIS — Z9889 Other specified postprocedural states: Secondary | ICD-10-CM | POA: Insufficient documentation

## 2022-08-24 DIAGNOSIS — N9489 Other specified conditions associated with female genital organs and menstrual cycle: Secondary | ICD-10-CM

## 2022-08-24 DIAGNOSIS — K589 Irritable bowel syndrome without diarrhea: Secondary | ICD-10-CM | POA: Insufficient documentation

## 2022-08-24 DIAGNOSIS — K219 Gastro-esophageal reflux disease without esophagitis: Secondary | ICD-10-CM | POA: Insufficient documentation

## 2022-08-24 DIAGNOSIS — Z09 Encounter for follow-up examination after completed treatment for conditions other than malignant neoplasm: Secondary | ICD-10-CM

## 2022-08-24 DIAGNOSIS — G43909 Migraine, unspecified, not intractable, without status migrainosus: Secondary | ICD-10-CM | POA: Insufficient documentation

## 2022-08-24 DIAGNOSIS — I209 Angina pectoris, unspecified: Secondary | ICD-10-CM | POA: Insufficient documentation

## 2022-08-24 DIAGNOSIS — Z90710 Acquired absence of both cervix and uterus: Secondary | ICD-10-CM | POA: Diagnosis not present

## 2022-08-24 DIAGNOSIS — R011 Cardiac murmur, unspecified: Secondary | ICD-10-CM | POA: Diagnosis not present

## 2022-08-24 HISTORY — DX: Cardiac murmur, unspecified: R01.1

## 2022-08-24 NOTE — Patient Instructions (Signed)
Medication Instructions:  Your physician recommends that you continue on your current medications as directed. Please refer to the Current Medication list given to you today.  *If you need a refill on your cardiac medications before your next appointment, please call your pharmacy*   Lab Work: NONE If you have labs (blood work) drawn today and your tests are completely normal, you will receive your results only by: [place] (if you have MyChart) OR A paper copy in the mail If you have any lab test that is abnormal or we need to change your treatment, we will call you to review the results.   Testing/Procedures: Your physician has requested that you have an echocardiogram. Echocardiography is a painless test that uses sound waves to create images of your heart. It provides your doctor with information about the size and shape of your heart and how well your heart's chambers and valves are working. This procedure takes approximately one hour. There are no restrictions for this procedure. Please do NOT wear cologne, perfume, aftershave, or lotions (deodorant is allowed). Please arrive 15 minutes prior to your appointment time.    Follow-Up: At [facility], you and your health needs are our priority.  As part of our continuing mission to provide you with exceptional heart care, we have created designated Provider Care Teams.  These Care Teams include your primary Cardiologist (physician) and Advanced Practice Providers (APPs -  Physician Assistants and Nurse Practitioners) who all work together to provide you with the care you need, when you need it.  We recommend signing up for the patient portal called "MyChart".  Sign up information is provided on this After Visit Summary.  MyChart is used to connect with patients for Virtual Visits (Telemedicine).  Patients are able to view lab/test results, encounter notes, upcoming appointments, etc.  Non-urgent messages can be sent to your  provider as well.   To learn more about what you can do with MyChart, go to [email or web address].    Your next appointment:   1 year(s)  Provider:   Jyl Heinz, MD    Other Instructions

## 2022-08-24 NOTE — Progress Notes (Signed)
Cardiology Office Note:    Date:  08/24/2022   ID:  Madison Morris, DOB [birth year], MRN [id number]  PCP:  Lona Kettle, NP  Cardiologist:  Jenean Lindau, MD   Referring MD: Radene Gunning, MD    ASSESSMENT:    1. Cardiac murmur   2. Palpitations    PLAN:    In order of problems listed above:  Primary prevention stressed with the patient.  Importance of compliance with diet medication stressed and she vocalized understanding. Cardiac murmur: Echocardiogram will be done to assess murmur heard on auscultation. Palpitations: These have mostly resolved.  She gives symptoms suggestive of orthostatic hypotension.  In view of recent events in her life such as hysterectomy and hematoma and the fact that she lost significant amount of blood I discussed this with her at length.  Adequate hydration and exercise was encouraged.  I told her to take extra salt and water in the diet.  If the symptoms persist compression stocking issue was also discussed at length with her.  She vocalized understanding and questions were answered to her satisfaction. Patient will be seen in follow-up appointment in 6 months or earlier if the patient has any concerns    Medication Adjustments/Labs and Tests Ordered: Current medicines are reviewed at length with the patient today.  Concerns regarding medicines are outlined above.  No orders of the defined types were placed in this encounter.  No orders of the defined types were placed in this encounter.    History of Present Illness:    Madison Morris is a 37 y.o. female who is being seen today for the evaluation of palpitations at the request of Radene Gunning, MD. patient is a pleasant 37 year old female.  She has no significant medical history from a cardiovascular standpoint.  She is seen here for palpitations.  Entire evaluation was done in the presence of Elizbeth Squires our receptionist to chaperone.  Patient mentions to me that she underwent  hysterectomy and subsequently had lightheadedness at times when she stands up.  This has resolved and gotten much better.  No chest pain orthopnea or dizziness now.  This happened during the time she had hysterectomy and significant pelvic hematoma.  She was concerned about it and came for evaluation.  Again no chest pain orthopnea or PND.  At the time of my evaluation, the patient is alert awake oriented and in no distress.  Past Medical History:  Diagnosis Date   Allergy    Mold, dust mites, [place] grass, gluten   Anemia July 2014   I believe I don't have the problem anymore   Anginal pain ([place])    in teens   Anxiety    Arthritis March 2018   At least in my knees, noted after accident   Asthma    Cardiac murmur 08/24/2022   Constipation 07/14/2022   COVID 07/2020   Depression    GERD (gastroesophageal reflux disease)    H. pylori infection    IBS (irritable bowel syndrome)    Irritable bowel syndrome without diarrhea 05/19/2011   Lactose intolerance 05/19/2011   Migraine    Migraine, unspecified, not intractable, without status migrainosus 12/26/2011   Pelvic hematoma in female 06/09/2022   PONV (postoperative nausea and vomiting)    Status post vaginal hysterectomy 06/09/2022    Past Surgical History:  Procedure Laterality Date   DILATION AND CURETTAGE OF UTERUS     HERNIA REPAIR  Augest 2018   Repaired, still have issues with it today  umbilcal hernia     VAGINAL HYSTERECTOMY Bilateral 06/07/2022   Procedure: HYSTERECTOMY VAGINAL WITH SALPINGECTOMY;  Surgeon: Radene Gunning, MD;  Location: [facility];  Service: Gynecology;  Laterality: Bilateral;   WISDOM TOOTH EXTRACTION      Current Medications: Current Meds  Medication Sig   albuterol (VENTOLIN HFA) 108 (90 Base) MCG/ACT inhaler Inhale 1-2 puffs into the lungs every 6 (six) hours as needed for shortness of breath or wheezing.   ascorbic acid (VITAMIN C) 500 MG tablet Take 500 mg by mouth daily at 12 noon.   bismuth  subsalicylate (PEPTO BISMOL) 262 MG chewable tablet Chew 524 mg by mouth 3 (three) times daily as needed for diarrhea or loose stools or indigestion.   calcium carbonate (TUMS - DOSED IN MG ELEMENTAL CALCIUM) 500 MG chewable tablet Chew 2 tablets by mouth 2 (two) times daily as needed for indigestion or heartburn.   Camphor-Menthol-Methyl Sal (SALONPAS) 3.07-16-08 % PTCH Place 1 patch onto the skin daily as needed (pain.).   Cholecalciferol (VITAMIN D3) 50 MCG (2000 UT) TABS Take 4,000 Units by mouth daily at 12 noon.   clonazePAM (KLONOPIN) 1 MG disintegrating tablet Take 1 mg by mouth no more than 2-3 times weekly as needed for severe anxiety only.   CREAM BASE EX Apply 1 Application topically 3 (three) times daily as needed (pain). Migrastil Soothing Neck and Shoulder Cream   famotidine-calcium carbonate-magnesium hydroxide (PEPCID COMPLETE) 10-800-165 MG chewable tablet Chew 1 tablet by mouth daily as needed (indigestion/heartburn).   fluticasone (FLONASE) 50 MCG/ACT nasal spray Place 1-2 sprays into both nostrils daily as needed for allergies.   Homeopathic Products (HOMEOPATHIC CALM SL) Take 1 tablet by mouth at bedtime as needed (sleep).  CBD, L-Theanine, and 5-HTP   HOMEOPATHIC PRODUCTS PO Take 1 tablet by mouth at bedtime as needed (sleep). Magnesium, GABA Supplement, L-Theanine, Lemon Balm   hyoscyamine (LEVSIN SL) 0.125 MG SL tablet Place 0.125 mg under the tongue 2 (two) times daily as needed (abdominal pain/cramping).   ibuprofen (ADVIL) 100 MG/5ML suspension Take 30 mLs (600 mg total) by mouth every 6 (six) hours as needed.   Lactase (LACTAID PO) Take 1 tablet by mouth daily as needed (dairy consumption.).   lansoprazole (PREVACID) 30 MG capsule Take 1 capsule (30 mg total) by mouth daily.   Liniments (BLUE-EMU SUPER STRENGTH EX) Apply 4 Applications topically 3 (three) times daily as needed (pain).   loratadine (CLARITIN) 10 MG tablet Take 10 mg by mouth at bedtime.   Magnesium Oxide  (MAG-OXIDE PO) Take 1 tablet by mouth at bedtime.   Menthol, Topical Analgesic, (BIOFREEZE EX) Apply 1 Application topically 3 (three) times daily as needed (pain.).   Multiple Vitamins-Minerals (CENTRUM MULTI + OMEGA 3 PO) Take 1 tablet by mouth daily at 12 noon.   ondansetron (ZOFRAN-ODT) 8 MG disintegrating tablet Take 1 tablet (8 mg total) by mouth every 8 (eight) hours as needed for nausea or vomiting.   PAPAYA ENZYMES PO Take 1 capsule by mouth 2 (two) times daily as needed (digestive health/regularity). Daily Cleanse Digestive Enzymes   Peppermint Oil (IBGARD PO) Take 1 capsule by mouth daily as needed (digestive health.).   phenylephrine (SUDAFED PE) 10 MG TABS tablet Take 10 mg by mouth every 4 (four) hours as needed (headaches/migraine).   Polyethyl Glycol-Propyl Glycol (LUBRICANT EYE DROPS) 0.4-0.3 % SOLN Place 1 drop into both eyes 3 (three) times daily as needed (dry/irritated eyes.).   Probiotic Product (UP4 PROBIOTICS WOMENS PO) Take 1 capsule by  mouth in the morning. Once Daily Women's Probiotic with 50 billion CFUs   pseudoephedrine (SUDAFED) 30 MG tablet Take 30 mg by mouth every 4 (four) hours as needed (headaches.).   rizatriptan (MAXALT-MLT) 10 MG disintegrating tablet Take 10 mg by mouth daily as needed for migraine.   simethicone (MYLICON) 80 MG chewable tablet Chew 80 mg by mouth See admin instructions. Take 1 tablet (80 mg) by mouth up to twice daily if needed for gas issues   tiZANidine (ZANAFLEX) 2 MG tablet Take 2 mg by mouth daily as needed (migraines/vertigo).   Wheat Dextrin (BENEFIBER PO) Take 1 Dose by mouth daily.     Allergies:   Tretinoin, Septra [sulfamethoxazole-trimethoprim], Gluten meal, and Lactose intolerance (gi)   Social History   Socioeconomic History   Marital status: Married    Spouse name: Sport and exercise psychologist   Number of children: 3   Years of education: Not on file   Highest education level: Not on file  Occupational History   Occupation: homemaker   Tobacco Use   Smoking status: Never   Smokeless tobacco: Never  Vaping Use   Vaping Use: Never used  Substance and Sexual Activity   Alcohol use: No   Drug use: No   Sexual activity: Yes    Partners: Male    Birth control/protection: None    Comment: vasectomy  Other Topics Concern   Not on file  Social History Narrative   Not on file   Social Determinants of Health   Financial Resource Strain: Not on file  Food Insecurity: No Food Insecurity (06/10/2022)   Hunger Vital Sign    Worried About Running Out of Food in the Last Year: Never true    Ran Out of Food in the Last Year: Never true  Transportation Needs: No Transportation Needs (06/10/2022)   PRAPARE - Hydrologist (Medical): No    Lack of Transportation (Non-Medical): No  Physical Activity: Not on file  Stress: Not on file  Social Connections: Not on file     Family History: The patient's family history includes Anxiety disorder in her daughter; Asthma in her father; Cancer in her paternal grandfather; Depression in her brother; Diabetes in her maternal grandmother; Early death in her mother; Hearing loss in her maternal grandmother; Hypertension in her mother; Pulmonary Hypertension in her mother.  ROS:   Please see the history of present illness.    All other systems reviewed and are negative.  EKGs/Labs/Other Studies Reviewed:    The following studies were reviewed today: EKG was sinus rhythm and nonspecific ST-T changes   Recent Labs: 06/10/2022: ALT 9; BUN 6; Creatinine, Ser 0.66; Potassium 4.0; Sodium 141 06/11/2022: Hemoglobin 11.0; Platelets 160  Recent Lipid Panel No results found for: "CHOL", "TRIG", "HDL", "CHOLHDL", "VLDL", "LDLCALC", "LDLDIRECT"  Physical Exam:    VS:  BP 99/70   Pulse 99   Ht 5' 6"$  (1.676 m)   Wt 139 lb 1.3 oz (63.1 kg)   LMP 05/06/2022   SpO2 99%   BMI 22.45 kg/m     Wt Readings from Last 3 Encounters:  08/24/22 139 lb 1.3 oz (63.1 kg)   08/24/22 139 lb (63 kg)  07/14/22 137 lb (62.1 kg)     GEN: Patient is in no acute distress HEENT: Normal NECK: No JVD; No carotid bruits LYMPHATICS: No lymphadenopathy CARDIAC: S1 S2 regular, 2/6 systolic murmur at the apex. RESPIRATORY:  Clear to auscultation without rales, wheezing or rhonchi  ABDOMEN:  Soft, non-tender, non-distended MUSCULOSKELETAL:  No edema; No deformity  SKIN: Warm and dry NEUROLOGIC:  Alert and oriented x 3 PSYCHIATRIC:  Normal affect    Signed, Jenean Lindau, MD  08/24/2022 3:56 PM    [facility]

## 2022-08-24 NOTE — Telephone Encounter (Signed)
error 

## 2022-09-19 ENCOUNTER — Ambulatory Visit (HOSPITAL_COMMUNITY): Payer: 59 | Attending: Cardiovascular Disease

## 2022-09-19 DIAGNOSIS — R011 Cardiac murmur, unspecified: Secondary | ICD-10-CM | POA: Insufficient documentation

## 2022-09-19 DIAGNOSIS — R002 Palpitations: Secondary | ICD-10-CM | POA: Diagnosis not present

## 2022-09-19 DIAGNOSIS — I361 Nonrheumatic tricuspid (valve) insufficiency: Secondary | ICD-10-CM

## 2022-09-19 DIAGNOSIS — I088 Other rheumatic multiple valve diseases: Secondary | ICD-10-CM

## 2022-09-20 LAB — ECHOCARDIOGRAM COMPLETE
Area-P 1/2: 2.87 cm2
S' Lateral: 3.1 cm

## 2022-10-06 ENCOUNTER — Encounter: Payer: Self-pay | Admitting: Obstetrics and Gynecology

## 2022-10-07 DIAGNOSIS — R000 Tachycardia, unspecified: Secondary | ICD-10-CM | POA: Diagnosis not present

## 2022-10-14 ENCOUNTER — Ambulatory Visit: Admit: 2022-10-14 | Payer: 59

## 2022-10-14 ENCOUNTER — Ambulatory Visit
Admission: EM | Admit: 2022-10-14 | Discharge: 2022-10-14 | Disposition: A | Discharge status: Home / Self Care | Payer: 59 | Attending: Family Medicine | Admitting: Family Medicine

## 2022-10-14 DIAGNOSIS — G43019 Migraine without aura, intractable, without status migrainosus: Secondary | ICD-10-CM

## 2022-10-14 MED ORDER — KETOROLAC TROMETHAMINE 60 MG/2ML IM SOLN
60.0000 mg | Freq: Once | INTRAMUSCULAR | Status: AC
  Administered 2022-10-14: 60 mg via INTRAMUSCULAR

## 2022-10-14 NOTE — ED Triage Notes (Signed)
Pt c/o migraine x 3 days. Requesting Toradol  injection. Zofran prn. No OTC meds.

## 2022-10-14 NOTE — ED Provider Notes (Signed)
[facility]    CSN: [id number] Arrival date & time: 10/14/22  1928      History   Chief Complaint Chief Complaint  Patient presents with   Migraine    X 3 days    HPI Madison Morris is a 37 y.o. female.   HPI [age] year old female presents with migraine for 3 days.  Request Toradol injection.  Is using Zofran as needed.  PMH significant for anemia, migraine, and IBS  Past Medical History:  Diagnosis Date   Allergy    Mold, dust mites, [place], gluten   Anemia July 2014   I believe I don't have the problem anymore   Anginal pain    in teens   Anxiety    Arthritis March 2018   At least in my knees, noted after accident   Asthma    Cardiac murmur 08/24/2022   Constipation 07/14/2022   COVID 07/2020   Depression    GERD (gastroesophageal reflux disease)    H. pylori infection    IBS (irritable bowel syndrome)    Irritable bowel syndrome without diarrhea 05/19/2011   Lactose intolerance 05/19/2011   Migraine    Migraine, unspecified, not intractable, without status migrainosus 12/26/2011   Pelvic hematoma in female 06/09/2022   PONV (postoperative nausea and vomiting)    Status post vaginal hysterectomy 06/09/2022    Patient Active Problem List   Diagnosis Date Noted   Allergy 08/24/2022   Anemia 08/24/2022   Anginal pain 08/24/2022   Arthritis 08/24/2022   GERD (gastroesophageal reflux disease) 08/24/2022   H. pylori infection 08/24/2022   IBS (irritable bowel syndrome) 08/24/2022   Migraine 08/24/2022   PONV (postoperative nausea and vomiting) 08/24/2022   Cardiac murmur 08/24/2022   Palpitations 08/24/2022   Constipation 07/14/2022   Status post vaginal hysterectomy 06/09/2022   Pelvic hematoma in female 06/09/2022   COVID 07/2020   Depression 04/20/2020   Migraine, unspecified, not intractable, without status migrainosus 12/26/2011   Irritable bowel syndrome without diarrhea 05/19/2011   Lactose intolerance 05/19/2011   Asthma 05/19/2011    Anxiety 05/18/2011    Past Surgical History:  Procedure Laterality Date   DILATION AND CURETTAGE OF UTERUS     HERNIA REPAIR  Augest 2018   Repaired, still have issues with it today   umbilcal hernia     VAGINAL HYSTERECTOMY Bilateral 06/07/2022   Procedure: HYSTERECTOMY VAGINAL WITH SALPINGECTOMY;  Surgeon: Milas Hockuncan, Paula, MD;  [facility];  Service: Gynecology;  Laterality: Bilateral;   WISDOM TOOTH EXTRACTION      OB History     Gravida  3   Para  3   Term  3   Preterm      AB      Living  3      SAB      IAB      Ectopic      Multiple      Live Births               Home Medications    Prior to Admission medications   Medication Sig Start Date End Date Taking? Authorizing Provider  albuterol (VENTOLIN HFA) 108 (90 Base) MCG/ACT inhaler Inhale 1-2 puffs into the lungs every 6 (six) hours as needed for shortness of breath or wheezing. 06/21/13   [provider]  ascorbic acid (VITAMIN C) 500 MG tablet Take 500 mg by mouth daily at 12 noon.    [provider]  bismuth subsalicylate (PEPTO  BISMOL) 262 MG chewable tablet Chew 524 mg by mouth 3 (three) times daily as needed for diarrhea or loose stools or indigestion.    [provider]  calcium carbonate (TUMS - DOSED IN MG ELEMENTAL CALCIUM) 500 MG chewable tablet Chew 2 tablets by mouth 2 (two) times daily as needed for indigestion or heartburn.    [provider]  Camphor-Menthol-Methyl Sal (SALONPAS) 3.07-16-08 % PTCH Place 1 patch onto the skin daily as needed (pain.).    [provider]  Cholecalciferol (VITAMIN D3) 50 MCG (2000 UT) TABS Take 4,000 Units by mouth daily at 12 noon.    [provider]  clonazePAM (KLONOPIN) 1 MG disintegrating tablet Take 1 mg by mouth no more than 2-3 times weekly as needed for severe anxiety only. 10/12/21   Christen ButterJessup, Joy, NP  CREAM BASE EX Apply 1 Application topically 3 (three) times daily as needed (pain). Migrastil  Soothing Neck and Shoulder Cream    [provider]  famotidine-calcium carbonate-magnesium hydroxide (PEPCID COMPLETE) 10-800-165 MG chewable tablet Chew 1 tablet by mouth daily as needed (indigestion/heartburn).    [provider]  fluticasone (FLONASE) 50 MCG/ACT nasal spray Place 1-2 sprays into both nostrils daily as needed for allergies.    [provider]  Homeopathic Products (HOMEOPATHIC CALM SL) Take 1 tablet by mouth at bedtime as needed (sleep).  CBD, L-Theanine, and 5-HTP    [provider]  HOMEOPATHIC PRODUCTS PO Take 1 tablet by mouth at bedtime as needed (sleep). Magnesium, GABA Supplement, L-Theanine, Lemon Balm    [provider]  hyoscyamine (LEVSIN SL) 0.125 MG SL tablet Place 0.125 mg under the tongue 2 (two) times daily as needed (abdominal pain/cramping).    [provider]  ibuprofen (ADVIL) 100 MG/5ML suspension Take 30 mLs (600 mg total) by mouth every 6 (six) hours as needed. 06/11/22   Milas Hockuncan, Paula, MD  Lactase (LACTAID PO) Take 1 tablet by mouth daily as needed (dairy consumption.).    [provider]  lansoprazole (PREVACID) 30 MG capsule Take 1 capsule (30 mg total) by mouth daily. 10/12/21   Christen ButterJessup, Joy, NP  Liniments (BLUE-EMU SUPER STRENGTH EX) Apply 4 Applications topically 3 (three) times daily as needed (pain).    [provider]  loratadine (CLARITIN) 10 MG tablet Take 10 mg by mouth at bedtime.    [provider]  Magnesium Oxide (MAG-OXIDE PO) Take 1 tablet by mouth at bedtime.    [provider]  Menthol, Topical Analgesic, (BIOFREEZE EX) Apply 1 Application topically 3 (three) times daily as needed (pain.).    [provider]  Multiple Vitamins-Minerals (CENTRUM MULTI + OMEGA 3 PO) Take 1 tablet by mouth daily at 12 noon.    [provider]  ondansetron (ZOFRAN-ODT) 8 MG disintegrating tablet Take 1 tablet (8 mg total) by mouth every 8 (eight) hours as  needed for nausea or vomiting. 06/11/22   Milas Hockuncan, Paula, MD  PAPAYA ENZYMES PO Take 1 capsule by mouth 2 (two) times daily as needed (digestive health/regularity). Daily Cleanse Digestive Enzymes    [provider]  Peppermint Oil (IBGARD PO) Take 1 capsule by mouth daily as needed (digestive health.).    [provider]  phenylephrine (SUDAFED PE) 10 MG TABS tablet Take 10 mg by mouth every 4 (four) hours as needed (headaches/migraine).    [provider]  Polyethyl Glycol-Propyl Glycol (LUBRICANT EYE DROPS) 0.4-0.3 % SOLN Place 1 drop into both eyes 3 (three) times daily as  needed (dry/irritated eyes.).    [provider]  Probiotic Product (UP4 PROBIOTICS WOMENS PO) Take 1 capsule by mouth in the morning. Once Daily Women's Probiotic with 50 billion CFUs    [provider]  pseudoephedrine (SUDAFED) 30 MG tablet Take 30 mg by mouth every 4 (four) hours as needed (headaches.).    [provider]  rizatriptan (MAXALT-MLT) 10 MG disintegrating tablet Take 10 mg by mouth daily as needed for migraine. 03/15/22   [provider]  simethicone (MYLICON) 80 MG chewable tablet Chew 80 mg by mouth See admin instructions. Take 1 tablet (80 mg) by mouth up to twice daily if needed for gas issues    [provider]  tiZANidine (ZANAFLEX) 2 MG tablet Take 2 mg by mouth daily as needed (migraines/vertigo).    [provider]  Wheat Dextrin (BENEFIBER PO) Take 1 Dose by mouth daily.    [provider]    Family History Family History  Problem Relation Age of Onset   Pulmonary Hypertension Mother    Early death Mother    Hypertension Mother    Asthma Father    Diabetes Maternal Grandmother    Hearing loss Maternal Grandmother    Cancer Paternal Grandfather    Anxiety disorder Daughter    Depression Brother     Social History Social History   Tobacco Use   Smoking status: Never   Smokeless tobacco: Never  Vaping  Use   Vaping Use: Never used  Substance Use Topics   Alcohol use: No   Drug use: No     Allergies   Tretinoin, Septra [sulfamethoxazole-trimethoprim], Gluten meal, and Lactose intolerance (gi)   Review of Systems Review of Systems  Neurological:  Positive for headaches.  All other systems reviewed and are negative.    Physical Exam Triage Vital Signs ED Triage Vitals  Enc Vitals Group     BP 10/14/22 1933 119/86     Pulse Rate 10/14/22 1933 96     Resp 10/14/22 1933 17     Temp 10/14/22 1933 97.7 F (36.5 C)     Temp Source 10/14/22 1933 Oral     SpO2 10/14/22 1933 98 %     Weight --      Height --      Head Circumference --      Peak Flow --      Pain Score 10/14/22 1935 6     Pain Loc --      Pain Edu? --      Excl. in GC? --    No data found.  Updated Vital Signs BP 119/86 (BP Location: Right Arm)   Pulse 96   Temp 97.7 F (36.5 C) (Oral)   Resp 17   LMP 05/06/2022   SpO2 98%   Visual Acuity Right Eye Distance:   Left Eye Distance:   Bilateral Distance:    Right Eye Near:   Left Eye Near:    Bilateral Near:     Physical Exam Vitals and nursing note reviewed.  Constitutional:      Appearance: Normal appearance. She is normal weight.  HENT:     Head: Normocephalic and atraumatic.     Right Ear: Tympanic membrane, ear canal and external ear normal.     Left Ear: Tympanic membrane, ear canal and external ear normal.     Nose: Nose normal.     Mouth/Throat:     Mouth: Mucous membranes are dry.  Pharynx: Oropharynx is clear.  Eyes:     Extraocular Movements: Extraocular movements intact.     Conjunctiva/sclera: Conjunctivae normal.     Pupils: Pupils are equal, round, and reactive to light.  Cardiovascular:     Rate and Rhythm: Normal rate and regular rhythm.     Pulses: Normal pulses.     Heart sounds: Normal heart sounds. No murmur heard. Pulmonary:     Effort: Pulmonary effort is normal.     Breath sounds: Normal breath sounds. No  wheezing, rhonchi or rales.  Musculoskeletal:        General: Normal range of motion.     Cervical back: Normal range of motion and neck supple. No tenderness.  Lymphadenopathy:     Cervical: No cervical adenopathy.  Skin:    General: Skin is warm and dry.  Neurological:     General: No focal deficit present.     Mental Status: She is alert and oriented to person, place, and time. Mental status is at baseline.      UC Treatments / Results  Labs (all labs ordered are listed, but only abnormal results are displayed) Labs Reviewed - No data to display  EKG   Radiology No results found.  Procedures Procedures (including critical care time)  Medications Ordered in UC Medications  ketorolac (TORADOL) injection 60 mg (60 mg Intramuscular Given 10/14/22 1940)    Initial Impression / Assessment and Plan / UC Course  I have reviewed the triage vital signs and the nursing notes.  Pertinent labs & imaging results that were available during my care of the patient were reviewed by me and considered in my medical decision making (see chart for details).     MDM: 1.  Intractable migraine without aura and without status migrainous-IM Toradol 60 mg given once in clinic per patient request. Encouraged increase daily water intake.  Advised patient if symptoms worsen and/or unresolved please follow-up with PCP or here for further evaluation.  Discharged home, hemodynamically stable. Final Clinical Impressions(s) / UC Diagnoses   Final diagnoses:  Intractable migraine without aura and without status migrainosus     Discharge Instructions      Encouraged increase daily water intake.  Advised patient if symptoms worsen and/or unresolved please follow-up with PCP or here for further evaluation.     ED Prescriptions   None    PDMP not reviewed this encounter.   Madison Iha, FNP 10/14/22 1949

## 2022-10-14 NOTE — Discharge Instructions (Addendum)
Encouraged increase daily water intake.  Advised patient if symptoms worsen and/or unresolved please follow-up with PCP or here for further evaluation.

## 2022-10-15 ENCOUNTER — Telehealth: Payer: Self-pay | Admitting: Emergency Medicine

## 2022-10-15 NOTE — Telephone Encounter (Signed)
Call to [facility]  to see how she was today & to see if she had any questions or concerns. Voice mail left. Call back # [phone]

## 2022-10-18 DIAGNOSIS — R000 Tachycardia, unspecified: Secondary | ICD-10-CM | POA: Diagnosis not present

## 2022-10-19 DIAGNOSIS — G4486 Cervicogenic headache: Secondary | ICD-10-CM | POA: Diagnosis not present

## 2022-10-19 DIAGNOSIS — F409 Phobic anxiety disorder, unspecified: Secondary | ICD-10-CM | POA: Diagnosis not present

## 2022-10-19 DIAGNOSIS — G43709 Chronic migraine without aura, not intractable, without status migrainosus: Secondary | ICD-10-CM | POA: Diagnosis not present

## 2022-11-09 DIAGNOSIS — R000 Tachycardia, unspecified: Secondary | ICD-10-CM | POA: Diagnosis not present

## 2022-11-13 DIAGNOSIS — I441 Atrioventricular block, second degree: Secondary | ICD-10-CM | POA: Diagnosis not present

## 2022-11-13 DIAGNOSIS — I493 Ventricular premature depolarization: Secondary | ICD-10-CM | POA: Diagnosis not present

## 2023-01-09 DIAGNOSIS — Z6823 Body mass index (BMI) 23.0-23.9, adult: Secondary | ICD-10-CM | POA: Diagnosis not present

## 2023-01-09 DIAGNOSIS — J018 Other acute sinusitis: Secondary | ICD-10-CM | POA: Diagnosis not present

## 2023-04-17 ENCOUNTER — Encounter: Payer: Self-pay | Admitting: Emergency Medicine

## 2023-04-17 ENCOUNTER — Ambulatory Visit
Admission: EM | Admit: 2023-04-17 | Discharge: 2023-04-17 | Disposition: A | Discharge status: Home / Self Care | Payer: 59 | Attending: Family Medicine | Admitting: Family Medicine

## 2023-04-17 DIAGNOSIS — Z711 Person with feared health complaint in whom no diagnosis is made: Secondary | ICD-10-CM

## 2023-04-17 DIAGNOSIS — M5431 Sciatica, right side: Secondary | ICD-10-CM | POA: Diagnosis not present

## 2023-04-17 DIAGNOSIS — R200 Anesthesia of skin: Secondary | ICD-10-CM

## 2023-04-17 MED ORDER — PREDNISONE 50 MG PO TABS: ORAL_TABLET | ORAL | 0 refills | Status: AC

## 2023-04-17 NOTE — ED Provider Notes (Signed)
[facility]    CSN: [id number] Arrival date & time: 04/17/23  1905      History   Chief Complaint Chief Complaint  Patient presents with   Right hand/wrist stiffness    HPI Madison Morris is a 37 y.o. female.   Patient is known to me from prior visits.  She has multiple medical.  She also suffers from anxiety.  She is here because she had some numbness in her right hand that comes and goes.  It is worse the last 2 days.  She awoke last night with numbness in her right leg as well.  It occurred to her that numbness in her right hand and her right leg might be a stroke.  She is here for evaluation.  She is not having any numbness or symptoms now. The numbness in her right leg is on the outer ankle region.  She states it has been coming and going since she was in college.  She has never had it worked up.  It is never caused her much problem. The numbness in her hand is more recent.  She has noticed just the last couple of days.  It sometimes wakes her up at night.  It seems to be in certain fingers, mostly thumb fourth and fifth.  She noticed it also with heavy use of her hand.  This does awaken her at night. Patient is fairly active because she is disabled with her multiple illnesses.  Her husband states that she sits too much.  She does spend a lot of time on her computer and her phone.     Past Medical History:  Diagnosis Date   Allergy    Mold, dust mites, [place] grass, gluten   Anemia July 2014   I believe I don't have the problem anymore   Anginal pain (HCC)    in teens   Anxiety    Arthritis March 2018   At least in my knees, noted after accident   Asthma    Cardiac murmur 08/24/2022   Constipation 07/14/2022   COVID 07/2020   Depression    GERD (gastroesophageal reflux disease)    H. pylori infection    IBS (irritable bowel syndrome)    Irritable bowel syndrome without diarrhea 05/19/2011   Lactose intolerance 05/19/2011   Migraine    Migraine,  unspecified, not intractable, without status migrainosus 12/26/2011   Pelvic hematoma in female 06/09/2022   PONV (postoperative nausea and vomiting)    Status post vaginal hysterectomy 06/09/2022    Patient Active Problem List   Diagnosis Date Noted   Allergy 08/24/2022   Anemia 08/24/2022   Arthritis 08/24/2022   GERD (gastroesophageal reflux disease) 08/24/2022   H. pylori infection 08/24/2022   IBS (irritable bowel syndrome) 08/24/2022   Migraine 08/24/2022   PONV (postoperative nausea and vomiting) 08/24/2022   Cardiac murmur 08/24/2022   Palpitations 08/24/2022   Constipation 07/14/2022   Status post vaginal hysterectomy 06/09/2022   Pelvic hematoma in female 06/09/2022   COVID 07/2020   Depression 04/20/2020   Migraine, unspecified, not intractable, without status migrainosus 12/26/2011   Irritable bowel syndrome without diarrhea 05/19/2011   Lactose intolerance 05/19/2011   Asthma 05/19/2011   Anxiety 05/18/2011    Past Surgical History:  Procedure Laterality Date   DILATION AND CURETTAGE OF UTERUS     HERNIA REPAIR  Augest 2018   Repaired, still have issues with it today   umbilcal hernia     VAGINAL HYSTERECTOMY Bilateral  06/07/2022   Procedure: HYSTERECTOMY VAGINAL WITH SALPINGECTOMY;  Surgeon: Milas Hock, MD;  Location: [facility];  Service: Gynecology;  Laterality: Bilateral;   WISDOM TOOTH EXTRACTION      OB History     Gravida  3   Para  3   Term  3   Preterm      AB      Living  3      SAB      IAB      Ectopic      Multiple      Live Births               Home Medications    Prior to Admission medications   Medication Sig Start Date End Date Taking? Authorizing Provider  albuterol (VENTOLIN HFA) 108 (90 Base) MCG/ACT inhaler Inhale 1-2 puffs into the lungs every 6 (six) hours as needed for shortness of breath or wheezing. 06/21/13  Yes [provider]  ascorbic acid (VITAMIN C) 500 MG tablet Take 500 mg by mouth  daily at 12 noon.   Yes [provider]  bismuth subsalicylate (PEPTO BISMOL) 262 MG chewable tablet Chew 524 mg by mouth 3 (three) times daily as needed for diarrhea or loose stools or indigestion.   Yes [provider]  calcium carbonate (TUMS - DOSED IN MG ELEMENTAL CALCIUM) 500 MG chewable tablet Chew 2 tablets by mouth 2 (two) times daily as needed for indigestion or heartburn.   Yes [provider]  Camphor-Menthol-Methyl Sal (SALONPAS) 3.07-16-08 % PTCH Place 1 patch onto the skin daily as needed (pain.).   Yes [provider]  Cholecalciferol (VITAMIN D3) 50 MCG (2000 UT) TABS Take 4,000 Units by mouth daily at 12 noon.   Yes [provider]  clonazePAM (KLONOPIN) 1 MG disintegrating tablet Take 1 mg by mouth no more than 2-3 times weekly as needed for severe anxiety only. 10/12/21  Yes Christen Butter, NP  CREAM BASE EX Apply 1 Application topically 3 (three) times daily as needed (pain). Migrastil Soothing Neck and Shoulder Cream   Yes [provider]  famotidine-calcium carbonate-magnesium hydroxide (PEPCID COMPLETE) 10-800-165 MG chewable tablet Chew 1 tablet by mouth daily as needed (indigestion/heartburn).   Yes [provider]  fluticasone (FLONASE) 50 MCG/ACT nasal spray Place 1-2 sprays into both nostrils daily as needed for allergies.   Yes [provider]  Homeopathic Products (HOMEOPATHIC CALM SL) Take 1 tablet by mouth at bedtime as needed (sleep).  CBD, L-Theanine, and 5-HTP   Yes [provider]  HOMEOPATHIC PRODUCTS PO Take 1 tablet by mouth at bedtime as needed (sleep). Magnesium, GABA Supplement, L-Theanine, Lemon Balm   Yes [provider]  hyoscyamine (LEVSIN SL) 0.125 MG SL tablet Place 0.125 mg under the tongue 2 (two) times daily as needed (abdominal pain/cramping).   Yes [provider]  ibuprofen (ADVIL) 100 MG/5ML suspension Take 30 mLs (600 mg total) by mouth every 6 (six) hours  as needed. 06/11/22  Yes Milas Hock, MD  Lactase (LACTAID PO) Take 1 tablet by mouth daily as needed (dairy consumption.).   Yes [provider]  lansoprazole (PREVACID) 30 MG capsule Take 1 capsule (30 mg total) by mouth daily. 10/12/21  Yes Jessup, Joy, NP  Liniments (BLUE-EMU SUPER STRENGTH EX) Apply 4 Applications topically 3 (three) times daily as needed (pain).   Yes [provider]  loratadine (CLARITIN) 10 MG tablet Take 10 mg by mouth at bedtime.   Yes  [provider]  Magnesium Oxide (MAG-OXIDE PO) Take 1 tablet by mouth at bedtime.   Yes [provider]  Menthol, Topical Analgesic, (BIOFREEZE EX) Apply 1 Application topically 3 (three) times daily as needed (pain.).   Yes [provider]  Multiple Vitamins-Minerals (CENTRUM MULTI + OMEGA 3 PO) Take 1 tablet by mouth daily at 12 noon.   Yes [provider]  ondansetron (ZOFRAN-ODT) 8 MG disintegrating tablet Take 1 tablet (8 mg total) by mouth every 8 (eight) hours as needed for nausea or vomiting. 06/11/22  Yes Milas Hock, MD  PAPAYA ENZYMES PO Take 1 capsule by mouth 2 (two) times daily as needed (digestive health/regularity). Daily Cleanse Digestive Enzymes   Yes [provider]  Peppermint Oil (IBGARD PO) Take 1 capsule by mouth daily as needed (digestive health.).   Yes [provider]  phenylephrine (SUDAFED PE) 10 MG TABS tablet Take 10 mg by mouth every 4 (four) hours as needed (headaches/migraine).   Yes [provider]  Polyethyl Glycol-Propyl Glycol (LUBRICANT EYE DROPS) 0.4-0.3 % SOLN Place 1 drop into both eyes 3 (three) times daily as needed (dry/irritated eyes.).   Yes [provider]  predniSONE (DELTASONE) 50 MG tablet Take once a day for 5 days.  Take with food 04/17/23  Yes Eustace Moore, MD  Probiotic Product (UP4 PROBIOTICS WOMENS PO) Take 1 capsule by mouth in the morning. Once Daily Women's Probiotic with 50 billion CFUs    Yes [provider]  pseudoephedrine (SUDAFED) 30 MG tablet Take 30 mg by mouth every 4 (four) hours as needed (headaches.).   Yes [provider]  rizatriptan (MAXALT-MLT) 10 MG disintegrating tablet Take 10 mg by mouth daily as needed for migraine. 03/15/22  Yes [provider]  simethicone (MYLICON) 80 MG chewable tablet Chew 80 mg by mouth See admin instructions. Take 1 tablet (80 mg) by mouth up to twice daily if needed for gas issues   Yes [provider]  tiZANidine (ZANAFLEX) 2 MG tablet Take 2 mg by mouth daily as needed (migraines/vertigo).   Yes [provider]  Wheat Dextrin (BENEFIBER PO) Take 1 Dose by mouth daily.   Yes [provider]    Family History Family History  Problem Relation Age of Onset   Pulmonary Hypertension Mother    Early death Mother    Hypertension Mother    Asthma Father    Diabetes Maternal Grandmother    Hearing loss Maternal Grandmother    Cancer Paternal Grandfather    Anxiety disorder Daughter    Depression Brother     Social History Social History   Tobacco Use   Smoking status: Never   Smokeless tobacco: Never  Vaping Use   Vaping status: Never Used  Substance Use Topics   Alcohol use: No   Drug use: No     Allergies   Tretinoin, Septra [sulfamethoxazole-trimethoprim], Gluten meal, and Lactose intolerance (gi)   Review of Systems Review of Systems See HPI  Physical Exam Triage Vital Signs ED Triage Vitals  Encounter Vitals Group     BP 04/17/23 1926 132/84     Systolic BP Percentile --      Diastolic BP Percentile --      Pulse Rate 04/17/23 1926 88     Resp 04/17/23 1926 18     Temp 04/17/23 1926 98.5 F (36.9 C)     Temp Source 04/17/23 1926 Oral     SpO2 04/17/23 1926 99 %  Weight 04/17/23 1928 147 lb (66.7 kg)     Height 04/17/23 1928 5\' 6"  (1.676 m)     Head Circumference --      Peak Flow --      Pain Score 04/17/23 1928 2     Pain Loc --      Pain  Education --      Exclude from Growth Chart --    No data found.  Updated Vital Signs BP 132/84 (BP Location: Left Arm)   Pulse 88   Temp 98.5 F (36.9 C) (Oral)   Resp 18   Ht 5\' 6"  (1.676 m)   Wt 66.7 kg   LMP 05/06/2022   SpO2 99%   BMI 23.73 kg/m       Physical Exam Constitutional:      General: She is not in acute distress.    Appearance: She is well-developed.     Comments: Worried about health  HENT:     Head: Normocephalic and atraumatic.  Eyes:     Conjunctiva/sclera: Conjunctivae normal.     Pupils: Pupils are equal, round, and reactive to light.  Cardiovascular:     Rate and Rhythm: Normal rate.  Pulmonary:     Effort: Pulmonary effort is normal. No respiratory distress.  Abdominal:     General: There is no distension.     Palpations: Abdomen is soft.  Musculoskeletal:        General: Normal range of motion.     Cervical back: Normal range of motion.  Skin:    General: Skin is warm and dry.  Neurological:     General: No focal deficit present.     Mental Status: She is alert. Mental status is at baseline.     Cranial Nerves: No cranial nerve deficit.     Sensory: No sensory deficit.     Motor: No weakness.     Coordination: Coordination normal.     Gait: Gait normal.     Deep Tendon Reflexes: Reflexes normal.     Comments: Patient has mild muscle tenderness in the right upper body of the trapezius and right paraspinous.  The motion is full.  Strength sensation range of motion and reflexes are normal in both upper extremities.  No sensory or focal deficits found.  Likewise the lower extremities have normal strength sensation range of motion and reflexes      UC Treatments / Results  Labs (all labs ordered are listed, but only abnormal results are displayed) Labs Reviewed - No data to display  EKG   Radiology No results found.  Procedures Procedures (including critical care time)  Medications Ordered in UC Medications - No data to  display  Initial Impression / Assessment and Plan / UC Course  I have reviewed the triage vital signs and the nursing notes.  Pertinent labs & imaging results that were available during my care of the patient were reviewed by me and considered in my medical decision making (see chart for details).     I think patient is having some numbness in her hand that might be a nerve inflammatory problem from overuse, possibly a cervical radiculopathy from prolonged head flexed posture.  She also has recurring sciatica numbness for 20 years.  Neither of these are urgent.  I recommend she follow-up with her primary care doctor for additional workup Final Clinical Impressions(s) / UC Diagnoses   Final diagnoses:  Hand numbness  Sciatica, right side  Physically well but worried  Discharge Instructions      Take the prednisone once a day for 5 days When this is finished you may take Advil or Aleve as an anti-inflammatory Reduce activities that cause you discomfort See    ED Prescriptions     Medication Sig Dispense Auth. Provider   predniSONE (DELTASONE) 50 MG tablet Take once a day for 5 days.  Take with food 5 tablet Eustace Moore, MD      PDMP not reviewed this encounter.   Eustace Moore, MD 04/17/23 2019

## 2023-04-17 NOTE — ED Triage Notes (Signed)
Patient states that she noticed her right hand and wrist tingling mid day on Saturday.  Patient does have a history of tingling.  Tonight while making dinner, her fingers became increasingly stiff and uncomfortable.  Patient did take Ibuprofen last night for the discomfort.

## 2023-04-17 NOTE — Discharge Instructions (Signed)
Take the prednisone once a day for 5 days When this is finished you may take Advil or Aleve as an anti-inflammatory Reduce activities that cause you discomfort See
